# Patient Record
Sex: Female | Born: 1960 | Race: White | Hispanic: No | Marital: Married | Smoking: Former smoker
Health system: Southern US, Community
[De-identification: ages and names within clinical notes are randomized; demographics above are authoritative.]

## PROBLEM LIST (undated history)

## (undated) DIAGNOSIS — E039 Hypothyroidism, unspecified: Secondary | ICD-10-CM

## (undated) DIAGNOSIS — G47419 Narcolepsy without cataplexy: Secondary | ICD-10-CM

---

## 1992-11-20 HISTORY — PX: ABDOMINAL HYSTERECTOMY: SHX81

## 2014-09-20 ENCOUNTER — Ambulatory Visit (INDEPENDENT_AMBULATORY_CARE_PROVIDER_SITE_OTHER): Payer: Commercial Managed Care - PPO | Admitting: Podiatry

## 2014-09-20 ENCOUNTER — Ambulatory Visit (INDEPENDENT_AMBULATORY_CARE_PROVIDER_SITE_OTHER): Payer: Commercial Managed Care - PPO

## 2014-09-20 ENCOUNTER — Encounter: Payer: Self-pay | Admitting: Podiatry

## 2014-09-20 VITALS — BP 124/81 | HR 79 | Resp 16 | Ht 67.0 in | Wt 170.0 lb

## 2014-09-20 DIAGNOSIS — M722 Plantar fascial fibromatosis: Secondary | ICD-10-CM

## 2014-09-20 MED ORDER — TRIAMCINOLONE ACETONIDE 10 MG/ML IJ SUSP
10.0000 mg | Freq: Once | INTRAMUSCULAR | Status: AC
  Administered 2014-09-20: 10 mg

## 2014-09-20 MED ORDER — DICLOFENAC SODIUM 75 MG PO TBEC: 75.0000 mg | DELAYED_RELEASE_TABLET | Freq: Two times a day (BID) | ORAL | Status: DC

## 2014-09-20 NOTE — Progress Notes (Signed)
Subjective:     Patient ID: Joan HansonSusan Case, female   DOB: 02-Apr-1961, 53 y.o.   MRN: [id number]  Foot Pain   patient states my right heel has been hurting me for around a year and has worsened over the last couple months. Does not remember initial injury   Review of Systems  All other systems reviewed and are negative.      Objective:   Physical Exam  Nursing note and vitals reviewed. Cardiovascular: Intact distal pulses.   Musculoskeletal: Normal range of motion.  Neurological: She is alert.  Skin: Skin is warm.   neurovascular status intact with muscle strength adequate and range of motion subtalar and midtarsal joint within normal limits. Patient's noted to have well-perfused toes moderate diminishment of arch upon weightbearing and is noted to have severe discomfort on the medial plantar fascial insertion into the calcaneus     Assessment:     Plantar fasciitis of the right heel with inflammation and fluid around the medial band    Plan:     H&P and x-ray reviewed and today I injected the right plantar fascia 3 mg Kenalog 5 mg Xylocaine and applied fascially brace placed on diclofenac 75 mg twice a day and gave instructions on physical therapy and supportive shoe gear. Reappoint her recheck in one week

## 2014-09-20 NOTE — Patient Instructions (Signed)

## 2014-09-20 NOTE — Progress Notes (Signed)
   Subjective:    Patient ID: Joan HansonSusan Case, female    DOB: 05/14/1961, 53 y.o.   MRN: [id number]  HPI Comments: "I have pain in the heel"  Patient c/o stabbing sensations plantar heel right for about 1 year. Worsened recently, last couple months. Pain AM. She is active in racquetball and much worse afterwards. Tried brace for plantar fasciitis-no much help, advil-some help.  Foot Pain      Review of Systems  Eyes: Positive for redness.  Musculoskeletal: Positive for gait problem.  Allergic/Immunologic: Positive for environmental allergies and food allergies.  All other systems reviewed and are negative.      Objective:   Physical Exam        Assessment & Plan:

## 2014-09-27 ENCOUNTER — Encounter: Payer: Self-pay | Admitting: Podiatry

## 2014-09-27 ENCOUNTER — Ambulatory Visit (INDEPENDENT_AMBULATORY_CARE_PROVIDER_SITE_OTHER): Payer: Commercial Managed Care - PPO | Admitting: Podiatry

## 2014-09-27 VITALS — BP 128/71 | HR 78 | Resp 13

## 2014-09-27 DIAGNOSIS — M722 Plantar fascial fibromatosis: Secondary | ICD-10-CM

## 2014-09-27 NOTE — Progress Notes (Signed)
Subjective:     Patient ID: Joan HansonSusan Case, female   DOB: 10-29-1961, 53 y.o.   MRN: [id number]  HPI patient presents stating I'm doing quite a bit better but I know that had a year history of this problem. Points the plantar aspect heel right   Review of Systems     Objective:   Physical Exam Neurovascular status intact with discomfort in the plantar aspect right heel at the insertion of the tendon into the calcaneus    Assessment:     Plantar fasciitis right with inflammation and fluid buildup around the medial band that is improved but still present with mechanical dysfunction of the foot noted    Plan:     Reviewed condition discussed physical therapy and supportive shoe gear usage and today recommended orthotics with scans performed for custom like devices to wear

## 2015-01-08 ENCOUNTER — Ambulatory Visit: Payer: Commercial Managed Care - PPO | Admitting: *Deleted

## 2015-01-08 DIAGNOSIS — M722 Plantar fascial fibromatosis: Secondary | ICD-10-CM

## 2015-01-08 NOTE — Patient Instructions (Signed)

## 2015-01-08 NOTE — Progress Notes (Signed)
PICKING UP MY ORTHOTICS  

## 2015-01-23 ENCOUNTER — Encounter: Payer: Self-pay | Admitting: Podiatry

## 2015-01-23 ENCOUNTER — Ambulatory Visit (INDEPENDENT_AMBULATORY_CARE_PROVIDER_SITE_OTHER): Payer: Commercial Managed Care - PPO | Admitting: Podiatry

## 2015-01-23 VITALS — BP 169/95 | HR 81 | Resp 16

## 2015-01-23 DIAGNOSIS — M722 Plantar fascial fibromatosis: Secondary | ICD-10-CM

## 2015-01-23 MED ORDER — MELOXICAM 15 MG PO TABS: 15.0000 mg | ORAL_TABLET | Freq: Every day | ORAL | Status: DC

## 2015-01-23 MED ORDER — TRIAMCINOLONE ACETONIDE 10 MG/ML IJ SUSP
10.0000 mg | Freq: Once | INTRAMUSCULAR | Status: AC
  Administered 2015-01-23: 10 mg

## 2015-01-23 NOTE — Progress Notes (Signed)
Subjective:     Patient ID: Joan HansonSusan Case, female   DOB: 29-Mar-1961, 54 y.o.   MRN: [id number]  HPI patient presents stating my right foot has been really sore in the heel for the last couple of months. Not remember specific injury but it has been bothering her specially when she gets up in the morning   Review of Systems     Objective:   Physical Exam Neurovascular status intact with no other changes noted and severe discomfort in the plantar heel right at the insertion of the tendon into the calcaneus    Assessment:     Acute plantar fasciitis right with inflammation and fluid buildup    Plan:     Reviewed condition and injected the plantar fascia 3 mg Kenalog 5 g Xylocaine and went ahead today and dispensed night splint with all instructions on usage

## 2017-03-17 ENCOUNTER — Ambulatory Visit (INDEPENDENT_AMBULATORY_CARE_PROVIDER_SITE_OTHER): Payer: Commercial Managed Care - PPO | Admitting: Orthopaedic Surgery

## 2017-03-17 ENCOUNTER — Ambulatory Visit (INDEPENDENT_AMBULATORY_CARE_PROVIDER_SITE_OTHER): Payer: Commercial Managed Care - PPO

## 2017-03-17 DIAGNOSIS — M25521 Pain in right elbow: Secondary | ICD-10-CM

## 2017-03-17 NOTE — Progress Notes (Signed)
   Office Visit Note   Patient: Joan Case           Date of Birth: 18-Nov-1961           MRN: [id number] Visit Date: 03/17/2017              Requested by: No referring provider defined for this encounter. PCP: No PCP Per Patient   Assessment & Plan: Visit Diagnoses:  1. Pain in right elbow     Plan: I gave her reassurance that her range of motion should improve with time and the fracture still need to heal. I want her to perform his much activities as she can tolerate without elbow including roping. I like see her back in 6 weeks I would like actually an AP lateral and oblique for 3 views of the right elbow.  Follow-Up Instructions: Return in about 6 weeks (around 04/28/2017).   Orders:  Orders Placed This Encounter  Procedures  . XR Elbow 2 Views Right   No orders of the defined types were placed in this encounter.     Procedures: No procedures performed   Clinical Data: No additional findings.   Subjective: No chief complaint on file. The patient is a very pleasant right-hand-dominant 56 year old who comes for second opinion as a relates to her right elbow. In November 2017 she injured both her left and right arms. Her right elbow still bothers her she is a very active individual and she said the elbow is bothersome it hurts he could fully extend it. 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 She denies any numbness and tingling.  HPI  Review of Systems He denies any chest pain, headache, short of breath, fever, chills, nausea, vomiting  Objective: Vital Signs: There were no vitals taken for this visit.  Physical Exam He is alert and oriented 3 and in no acute distress Ortho Exam Examination of her right elbow shows full flexion but she lacks full extension by about 5. Her pronation and supination are full. She is neurovascular intact. The elbow is painful and stressing around the radial head. Specialty Comments:    No specialty comments available.  Imaging: No results found. 2 views of the right elbow show a subacute nondisplaced right radial head fracture. The elbow is well located and in good alignment and position.  PMFS History: There are no active problems to display for this patient.  No past medical history on file.  No family history on file.  No past surgical history on file. Social History   Occupational History  . Not on file.   Social History Main Topics  . Smoking status: Current Some Day Smoker  . Smokeless tobacco: Not on file  . Alcohol use Not on file  . Drug use: Unknown  . Sexual activity: Not on file

## 2018-11-30 ENCOUNTER — Emergency Department (HOSPITAL_COMMUNITY)
Admission: EM | Admit: 2018-11-30 | Discharge: 2018-11-30 | Disposition: A | Discharge status: Home / Self Care | Payer: Self-pay | Attending: Emergency Medicine | Admitting: Emergency Medicine

## 2018-11-30 ENCOUNTER — Emergency Department (HOSPITAL_COMMUNITY): Payer: Self-pay

## 2018-11-30 ENCOUNTER — Encounter (HOSPITAL_COMMUNITY): Payer: Self-pay | Admitting: Student

## 2018-11-30 ENCOUNTER — Other Ambulatory Visit: Payer: Self-pay

## 2018-11-30 DIAGNOSIS — Z79899 Other long term (current) drug therapy: Secondary | ICD-10-CM | POA: Insufficient documentation

## 2018-11-30 DIAGNOSIS — F17210 Nicotine dependence, cigarettes, uncomplicated: Secondary | ICD-10-CM | POA: Insufficient documentation

## 2018-11-30 DIAGNOSIS — Z91010 Allergy to peanuts: Secondary | ICD-10-CM | POA: Insufficient documentation

## 2018-11-30 DIAGNOSIS — N200 Calculus of kidney: Secondary | ICD-10-CM | POA: Insufficient documentation

## 2018-11-30 LAB — URINALYSIS, ROUTINE W REFLEX MICROSCOPIC
Bacteria, UA: NONE SEEN
Bilirubin Urine: NEGATIVE
Glucose, UA: NEGATIVE mg/dL
Hgb urine dipstick: NEGATIVE
Ketones, ur: 5 mg/dL — AB
Leukocytes, UA: NEGATIVE
Nitrite: NEGATIVE
Protein, ur: NEGATIVE mg/dL
Specific Gravity, Urine: 1.011 (ref 1.005–1.030)
pH: 7 (ref 5.0–8.0)

## 2018-11-30 LAB — COMPREHENSIVE METABOLIC PANEL
ALT: 24 U/L (ref 0–44)
AST: 20 U/L (ref 15–41)
Albumin: 3.7 g/dL (ref 3.5–5.0)
Alkaline Phosphatase: 88 U/L (ref 38–126)
Anion gap: 9 (ref 5–15)
BUN: 21 mg/dL — AB (ref 6–20)
CO2: 24 mmol/L (ref 22–32)
Calcium: 9.3 mg/dL (ref 8.9–10.3)
Chloride: 106 mmol/L (ref 98–111)
Creatinine, Ser: 0.84 mg/dL (ref 0.44–1.00)
GFR calc Af Amer: >60 mL/min (ref >60)
GFR calc non Af Amer: >60 mL/min (ref >60)
Glucose, Bld: 102 mg/dL — ABNORMAL HIGH (ref 70–99)
Potassium: 4.1 mmol/L (ref 3.5–5.1)
Sodium: 139 mmol/L (ref 135–145)
Total Bilirubin: 1.1 mg/dL (ref 0.3–1.2)
Total Protein: 6.7 g/dL (ref 6.5–8.1)

## 2018-11-30 LAB — LIPASE, BLOOD: Lipase: 33 U/L (ref 11–51)

## 2018-11-30 LAB — CBC
HCT: 39.8 % (ref 36.0–46.0)
Hemoglobin: 12.7 g/dL (ref 12.0–15.0)
MCH: 28.5 pg (ref 26.0–34.0)
MCHC: 31.9 g/dL (ref 30.0–36.0)
MCV: 89.4 fL (ref 80.0–100.0)
Platelets: 285 10*3/uL (ref 150–400)
RBC: 4.45 MIL/uL (ref 3.87–5.11)
RDW: 12.5 % (ref 11.5–15.5)
WBC: 8.8 10*3/uL (ref 4.0–10.5)
nRBC: 0 % (ref 0.0–0.2)

## 2018-11-30 MED ORDER — OXYCODONE-ACETAMINOPHEN 5-325 MG PO TABS: 1.0000 | ORAL_TABLET | Freq: Four times a day (QID) | ORAL | 0 refills | Status: DC | PRN

## 2018-11-30 MED ORDER — ONDANSETRON HCL 4 MG/2ML IJ SOLN
4.0000 mg | Freq: Once | INTRAMUSCULAR | Status: AC
  Administered 2018-11-30: 4 mg via INTRAVENOUS
  Filled 2018-11-30: qty 2

## 2018-11-30 MED ORDER — SODIUM CHLORIDE 0.9 % IV BOLUS
1000.0000 mL | Freq: Once | INTRAVENOUS | Status: AC
  Administered 2018-11-30: 1000 mL via INTRAVENOUS

## 2018-11-30 MED ORDER — TAMSULOSIN HCL 0.4 MG PO CAPS: 0.4000 mg | ORAL_CAPSULE | Freq: Every day | ORAL | 0 refills | Status: DC

## 2018-11-30 MED ORDER — IBUPROFEN 800 MG PO TABS: 800.0000 mg | ORAL_TABLET | Freq: Three times a day (TID) | ORAL | 0 refills | Status: DC

## 2018-11-30 MED ORDER — MORPHINE SULFATE (PF) 4 MG/ML IV SOLN
4.0000 mg | Freq: Once | INTRAVENOUS | Status: AC
  Administered 2018-11-30: 4 mg via INTRAVENOUS
  Filled 2018-11-30: qty 1

## 2018-11-30 MED ORDER — HYDROMORPHONE HCL 1 MG/ML IJ SOLN
1.0000 mg | Freq: Once | INTRAMUSCULAR | Status: AC
  Administered 2018-11-30: 1 mg via INTRAVENOUS
  Filled 2018-11-30: qty 1

## 2018-11-30 MED ORDER — SODIUM CHLORIDE 0.9 % IV SOLN
INTRAVENOUS | Status: DC
  Administered 2018-11-30: 19:00:00 via INTRAVENOUS

## 2018-11-30 MED ORDER — KETOROLAC TROMETHAMINE 15 MG/ML IJ SOLN
15.0000 mg | Freq: Once | INTRAMUSCULAR | Status: AC
  Administered 2018-11-30: 15 mg via INTRAVENOUS
  Filled 2018-11-30: qty 1

## 2018-11-30 MED ORDER — ONDANSETRON 4 MG PO TBDP: 4.0000 mg | ORAL_TABLET | Freq: Three times a day (TID) | ORAL | 0 refills | Status: DC | PRN

## 2018-11-30 MED ORDER — HYDROMORPHONE HCL 1 MG/ML IJ SOLN
0.5000 mg | Freq: Once | INTRAMUSCULAR | Status: AC
  Administered 2018-11-30: 0.5 mg via INTRAVENOUS
  Filled 2018-11-30: qty 1

## 2018-11-30 NOTE — ED Notes (Signed)
Patient transported to CT 

## 2018-11-30 NOTE — ED Provider Notes (Signed)
[facility] EMERGENCY [facility]PARTMENT Provider Note   CSN: [id number] Arrival date & time: 11/30/18  1708     History   Chief Complaint Chief Complaint  Patient presents with  . Flank Pain    HPI Karena AddisonSusan Haltiwanger is a 57 y.o. female with a hx of tobacco abuse and narcolepsy who presents to the ED with complaints of L flank pain that started at 15:30 today. Patient states that yesterday she developed some increased urinary frequency and then today developed pain. She states pain is in the L flank and radiates to the LLQ. Pain waxes/wanes, currently a 10/10 in severity without specific alleviating/aggravating factors. Has had some nausea without vomiting. Denies fever, chills, vomiting, dysuria, hematuria, or constipation. She has had a kidney stone several years ago and this feels somewhat similar.   HPI  History reviewed. No pertinent past medical history.  There are no active problems to display for this patient.   History reviewed. No pertinent surgical history.   OB History   None      Home Medications    Prior to Admission medications   Medication Sig Start Date End Date Taking? Authorizing Provider  diclofenac (VOLTAREN) 75 MG EC tablet Take 1 tablet (75 mg total) by mouth 2 (two) times daily. 09/20/14   Lenn Sinkegal, Norman S, DPM  levothyroxine (LEVOTHROID) 125 MCG tablet Take 125 mcg by mouth daily before breakfast.    [provider]  lisdexamfetamine (VYVANSE) 70 MG capsule Take 70 mg by mouth daily.    [provider]  meloxicam (MOBIC) 15 MG tablet Take 1 tablet (15 mg total) by mouth daily. 01/23/15   Lenn Sinkegal, Norman S, DPM    Family History History reviewed. No pertinent family history.  Social History Social History   Tobacco Use  . Smoking status: Current Some Day Smoker  Substance Use Topics  . Alcohol use: Not on file  . Drug use: Not on file     Allergies   Betadine [povidone iodine]; Fruit & vegetable daily [nutritional  supplements]; and Peanut-containing drug products   Review of Systems Review of Systems  Constitutional: Negative for chills and fever.  Respiratory: Negative for shortness of breath.   Cardiovascular: Negative for chest pain.  Gastrointestinal: Positive for abdominal pain and nausea. Negative for constipation and vomiting.  Genitourinary: Positive for flank pain and frequency. Negative for dysuria, vaginal bleeding and vaginal discharge.  All other systems reviewed and are negative.    Physical Exam Updated Vital Signs BP (!) 171/114 (BP Location: Right Arm)   Pulse 77   Temp 97.8 F (36.6 C) (Oral)   Resp (!) 22   Ht 5\' 7"  (1.702 m)   Wt 74.8 kg   SpO2 100%   BMI 25.84 kg/m   Physical Exam  Constitutional: She appears well-developed and well-nourished.  Non-toxic appearance. She appears distressed (appears somewhat uncomfortable. ).  HENT:  Head: Normocephalic and atraumatic.  Eyes: Conjunctivae are normal. Right eye exhibits no discharge. Left eye exhibits no discharge.  Neck: Neck supple.  Cardiovascular: Normal rate and regular rhythm.  Pulmonary/Chest: Effort normal and breath sounds normal. No respiratory distress. She has no wheezes. She has no rhonchi. She has no rales.  Respiration even and unlabored  Abdominal: Soft. She exhibits no distension. There is no tenderness. There is no rigidity, no rebound and no guarding.  Neurological: She is alert.  Clear speech.   Skin: Skin is warm and dry. No rash noted.  Psychiatric: She has a  normal mood and affect. Her behavior is normal.  Nursing note and vitals reviewed.    ED Treatments / Results  Labs (all labs ordered are listed, but only abnormal results are displayed) Labs Reviewed  URINALYSIS, ROUTINE W REFLEX MICROSCOPIC - Abnormal; Notable for the following components:      Result Value   Color, Urine STRAW (*)    Ketones, ur 5 (*)    All other components within normal limits  COMPREHENSIVE METABOLIC PANEL  - Abnormal; Notable for the following components:   Glucose, Bld 102 (*)    BUN 21 (*)    All other components within normal limits  CBC  LIPASE, BLOOD    EKG None  Radiology Ct Renal Stone Study  Result Date: 11/30/2018 CLINICAL DATA:  Flank pain EXAM: CT ABDOMEN AND PELVIS WITHOUT CONTRAST TECHNIQUE: Multidetector CT imaging of the abdomen and pelvis was performed following the standard protocol without IV contrast. COMPARISON:  None. FINDINGS: Lower chest: No acute abnormality. Hepatobiliary: No focal liver abnormality is seen. No gallstones, gallbladder wall thickening, or biliary dilatation. Pancreas: Unremarkable. No pancreatic ductal dilatation or surrounding inflammatory changes. Spleen: Normal in size without focal abnormality. Adrenals/Urinary Tract: Adrenal glands are unremarkable. 2 mm left distal ureteral calculus resulting in mild left hydroureteronephrosis. No other urolithiasis. Normal decompressed bladder. Bladder is unremarkable. Stomach/Bowel: Stomach is within normal limits. No evidence of bowel wall thickening, distention, or inflammatory changes. Vascular/Lymphatic: No significant vascular findings are present. No enlarged abdominal or pelvic lymph nodes. Reproductive: Status post hysterectomy. No adnexal masses. Other: No abdominal wall hernia or abnormality. No abdominopelvic ascites. Musculoskeletal: No acute osseous abnormality. No aggressive osseous lesion. Grade 1 anterolisthesis of L4 on L5 secondary to facet disease. Minimal retrolisthesis of L1 on L2 and L2 on L3. Facet arthropathy throughout the lumbar spine. IMPRESSION: 1. 2 mm left distal ureteral calculus resulting in mild left hydroureteronephrosis. Electronically Signed   By: Elige Ko   On: 11/30/2018 18:14    Procedures Procedures (including critical care time)  Medications Ordered in ED Medications  sodium chloride 0.9 % bolus 1,000 mL (1,000 mLs Intravenous New Bag/Given 11/30/18 1745)    And  0.9  %  sodium chloride infusion (has no administration in time range)  morphine 4 MG/ML injection 4 mg (4 mg Intravenous Given 11/30/18 1744)  ondansetron (ZOFRAN) injection 4 mg (4 mg Intravenous Given 11/30/18 1744)     Initial Impression / Assessment and Plan / ED Course  I have reviewed the triage vital signs and the nursing notes.  Pertinent labs & imaging results that were available during my care of the patient were reviewed by me and considered in my medical decision making (see chart for details).    Patient presents to the ED with complaints of flank/abdominal pain & urinary frequency. Patient nontoxic, appears uncomfortable, BP elevated. On exam patient without peritoneal signs. DDX: nephrolithiasis, pyelonephritis/UTI, cholecystitis, pancreatitis, bowel obstruction/perforation, appendicitis, dissection, feel nephrolithiasis is most likely at this time will evaluate with labs and CT renal study, analgesics, anti-emetics, and fluids ordered.   No leukocytosis, anemia, or significant electrolyte derangements. LFTs/lipase WNL. CT renal study with 2 mm left distal ureteral calculus resulting in mild left hydroureteronephrosis. Renal function preserved. Urinalysis without appearance of superimposed infection. Patient tolerating PO in the ER with pain well controlled. Will discharge home with Flomax, Zofran, NSAID, and Percocet with urology follow up. [place] Controlled Substance reporting System queried. I discussed results, treatment plan, need for urology follow-up, and return precautions  with the patient. Provided opportunity for questions, patient confirmed understanding and is in agreement with plan.   Vitals:   11/30/18 1830 11/30/18 2045  BP: (!) 161/84 126/67  Pulse: 84 71  Resp: (!) 25 14  Temp:    SpO2: 97% 97%    Final Clinical Impressions(s) / ED Diagnoses   Final diagnoses:  Kidney stone    ED Discharge Orders         Ordered    ondansetron (ZOFRAN ODT) 4 MG  disintegrating tablet  Every 8 hours PRN     11/30/18 2045    ibuprofen (ADVIL,MOTRIN) 800 MG tablet  3 times daily     11/30/18 2045    tamsulosin (FLOMAX) 0.4 MG CAPS capsule  Daily after supper     11/30/18 2045    oxyCODONE-acetaminophen (PERCOCET/ROXICET) 5-325 MG tablet  Every 6 hours PRN     11/30/18 2045           Cherly Anderson, PA-C 11/30/18 2058    Virgina Norfolk, DO 11/30/18 2257

## 2018-11-30 NOTE — ED Triage Notes (Signed)
Pt presents to ED with L flank pain started today at 1530. Urinary frequency since yesterday, diarrhea x1 day.

## 2018-11-30 NOTE — Discharge Instructions (Addendum)
You were seen in the emergency department and found to have a kidney stone.  We are sending you home with multiple medications to assist with passing the stone:   -Flomax-this is a medication to help pass the stone, it allows urine to exit the body more freely.  Please take this once daily with a meal.  -Ibuprofen 800 mg-this is a medication that will help with pain as well as passing the stone.  Please take this every 8 hours.  Take this with food as it can cause stomach upset and at worst stomach bleeding.  Do not take other NSAIDs such as Motrin, Aleve, Advil, Mobic, or Naproxen with this medicine as they are similar and would propagate any potential side effects.   -Percocet-this is a narcotic/controlled substance medication that has potential addicting qualities.  We recommend that you take 1-2 tablets every 6 hours as needed for severe pain.  Do not drive or operate heavy machinery when taking this medicine as it can be sedating. Do not drink alcohol or take other sedating medications when taking this medicine for safety reasons.  Keep this out of reach of small children.  Please be aware this medicine has Tylenol in it (325 mg/tab) do not exceed the maximum dose of Tylenol in a day per over the counter recommendations should you decide to supplement with Tylenol over the counter.   -Zofran-this is an antinausea medication, you may take this every 8 hours as needed for nausea and vomiting, please allow the tablet to dissolve underneath of your tongue.   We have prescribed you new medication(s) today. Discuss the medications prescribed today with your pharmacist as they can have adverse effects and interactions with your other medicines including over the counter and prescribed medications. Seek medical evaluation if you start to experience new or abnormal symptoms after taking one of these medicines, seek care immediately if you start to experience difficulty breathing, feeling of your throat  closing, facial swelling, or rash as these could be indications of a more serious allergic reaction  Please follow-up with the urology group provided in your discharge instructions within 3 to 5 days.  Return to the ER for new or worsening symptoms including but not limited to worsening pain not controlled by these medicines, inability to keep fluids down, fever, or any other concerns that you may have.  

## 2018-12-28 ENCOUNTER — Ambulatory Visit (INDEPENDENT_AMBULATORY_CARE_PROVIDER_SITE_OTHER): Payer: BLUE CROSS/BLUE SHIELD | Admitting: Orthopaedic Surgery

## 2018-12-28 ENCOUNTER — Ambulatory Visit (INDEPENDENT_AMBULATORY_CARE_PROVIDER_SITE_OTHER): Payer: Self-pay

## 2018-12-28 DIAGNOSIS — M25562 Pain in left knee: Secondary | ICD-10-CM | POA: Diagnosis not present

## 2018-12-28 DIAGNOSIS — G8929 Other chronic pain: Secondary | ICD-10-CM | POA: Diagnosis not present

## 2018-12-28 MED ORDER — DICLOFENAC SODIUM 75 MG PO TBEC: 75.0000 mg | DELAYED_RELEASE_TABLET | Freq: Two times a day (BID) | ORAL | 2 refills | Status: DC | PRN

## 2018-12-28 MED ORDER — METHYLPREDNISOLONE ACETATE 40 MG/ML IJ SUSP
40.0000 mg | INTRAMUSCULAR | Status: AC | PRN
  Administered 2018-12-28: 40 mg via INTRA_ARTICULAR

## 2018-12-28 MED ORDER — LIDOCAINE HCL 1 % IJ SOLN
3.0000 mL | INTRAMUSCULAR | Status: AC | PRN
  Administered 2018-12-28: 3 mL

## 2018-12-28 NOTE — Progress Notes (Signed)
Office Visit Note   Patient: Joan Case           Date of Birth: 09-04-1961           MRN: [id number] Visit Date: 12/28/2018              Requested by: No referring provider defined for this encounter. PCP: Patient, No Pcp Per   Assessment & Plan: Visit Diagnoses:  1. Chronic pain of left knee     Plan: I did speak with her about trying a steroid injection in her left knee today and she is agreeable to this.  I will refill her diclofenac.  I would like her to come back in about 2 weeks to see if she can bring me the MRI study and the report so we can get a better idea of what else may be going on with the knee.  All questions concerns were answered and addressed.  We will see her back in 2 weeks.  Follow-Up Instructions: Return in about 2 years (around 12/28/2020).   Orders:  Orders Placed This Encounter  Procedures  . Large Joint Inj  . XR Knee 1-2 Views Left   Meds ordered this encounter  Medications  . diclofenac (VOLTAREN) 75 MG EC tablet    Sig: Take 1 tablet (75 mg total) by mouth 2 (two) times daily between meals as needed.    Dispense:  60 tablet    Refill:  2      Procedures: Large Joint Inj: R knee on 12/28/2018 3:33 PM Indications: diagnostic evaluation and pain Details: 22 G 1.5 in needle, superolateral approach  Arthrogram: No  Medications: 3 mL lidocaine 1 %; 40 mg methylPREDNISolone acetate 40 MG/ML Outcome: tolerated well, no immediate complications Procedure, treatment alternatives, risks and benefits explained, specific risks discussed. Consent was given by the patient. Immediately prior to procedure a time out was called to verify the correct patient, procedure, equipment, support staff and site/side marked as required. Patient was prepped and draped in the usual sterile fashion.       Clinical Data: No additional findings.   Subjective: Chief Complaint  Patient presents with  . Left Knee - Pain  The patient comes in today for evaluation  treatment of left knee pain.  She had a twisting injury to this knee a few months ago.  She hurts in the posterior medial aspect of the knee and cannot do squats well.  The knee does not swell on her but she is getting pretty much chronic pain off and on in the knee now and does wake her up at night.  She has not injured this knee before.  She is injured her right knee before but has no issues now.  She did report that she had [place] in [place] in the emergency room but was not told any results.  She does take diclofenac on occasion and this did help but she does not have it anymore.  HPI  Review of Systems She currently denies any headache, chest pain, shortness of breath, fever, chills, nausea, vomiting.  Objective: Vital Signs: There were no vitals taken for this visit.  Physical Exam She is alert and orient x3 and in no acute distress Ortho Exam Examination of her left knee shows a positive Murray sign to the medial compartment of the knee.  Ligamentously the knee feels stable with a negative Lockman's exam.  There is no instability on varus valgus stressing.  There is no effusion.  She has pain past 90 degrees of flexion to the medial side. Specialty Comments:  No specialty comments available.  Imaging: Xr Knee 1-2 Views Left  Result Date: 12/28/2018 2 views of the left knee show well-maintained medial lateral compartment joint spaces.  There is mild patellofemoral narrowing.  There is otherwise no acute findings.    PMFS History: There are no active problems to display for this patient.  No past medical history on file.  No family history on file.  No past surgical history on file. Social History   Occupational History  . Not on file  Tobacco Use  . Smoking status: Current Some Day Smoker  Substance and Sexual Activity  . Alcohol use: Not on file  . Drug use: Not on file  . Sexual activity: Not on file

## 2019-06-27 ENCOUNTER — Other Ambulatory Visit: Payer: Self-pay

## 2019-06-27 ENCOUNTER — Ambulatory Visit (INDEPENDENT_AMBULATORY_CARE_PROVIDER_SITE_OTHER): Payer: BC Managed Care – PPO | Admitting: Orthopaedic Surgery

## 2019-06-27 DIAGNOSIS — G8929 Other chronic pain: Secondary | ICD-10-CM | POA: Diagnosis not present

## 2019-06-27 DIAGNOSIS — L237 Allergic contact dermatitis due to plants, except food: Secondary | ICD-10-CM | POA: Diagnosis not present

## 2019-06-27 DIAGNOSIS — M25562 Pain in left knee: Secondary | ICD-10-CM

## 2019-06-27 MED ORDER — METHYLPREDNISOLONE ACETATE 40 MG/ML IJ SUSP
40.0000 mg | INTRAMUSCULAR | Status: AC | PRN
  Administered 2019-06-27: 40 mg via INTRA_ARTICULAR

## 2019-06-27 MED ORDER — LIDOCAINE HCL 1 % IJ SOLN
3.0000 mL | INTRAMUSCULAR | Status: AC | PRN
  Administered 2019-06-27: 3 mL

## 2019-06-27 NOTE — Progress Notes (Signed)
Office Visit Note   Patient: Joan Case           Date of Birth: 05/22/1961           MRN: [id number] Visit Date: 06/27/2019              Requested by: No referring provider defined for this encounter. PCP: Patient, No Pcp Per   Assessment & Plan: Visit Diagnoses:  1. Chronic pain of left knee   2. Poison ivy     Plan: At this point I do feel an MRI is warranted for her left knee given the failure of conservative treatment including steroid injections as well as activity modification and rest as well as anti-inflammatories and quad strengthening exercises.  We did place another steroid injection in her knee today given the fact that it may be difficult to get MRI in light of the coronavirus pandemic however we will still order this and likely she will be able to get it.  We also had a steroid injection provided in her gluteal area to treat her poison ivy.  All question concerns were answered and addressed.  We will see her back after the MRI of her left knee.  Follow-Up Instructions: Follow up after left knee MRI  Orders:  Orders Placed This Encounter  Procedures  . Large Joint Inj   No orders of the defined types were placed in this encounter.     Procedures: Large Joint Inj: L knee on 06/27/2019 10:06 AM Indications: diagnostic evaluation and pain Details: 22 G 1.5 in needle, superolateral approach  Arthrogram: No  Medications: 3 mL lidocaine 1 %; 40 mg methylPREDNISolone acetate 40 MG/ML Outcome: tolerated well, no immediate complications Procedure, treatment alternatives, risks and benefits explained, specific risks discussed. Consent was given by the patient. Immediately prior to procedure a time out was called to verify the correct patient, procedure, equipment, support staff and site/side marked as required. Patient was prepped and draped in the usual sterile fashion.       Clinical Data: No additional findings.   Subjective: Chief Complaint  Patient  presents with  . Left Knee - Pain  The patient is well-known to me.  She has been dealing with left knee pain for over 6 to 7 months now.  We saw her in January and place a steroid injection in her knee.  She is still having a stabbing pain with locking catching with that left knee.  She had twisted her right knee as well but the right knee is never been symptomatic.  She did also recently fall into poison ivy and is gotten that over her arms and legs.  HPI  Review of Systems She currently denies any headache, chest pain, shortness of breath, fever, chills, nausea, vomiting  Objective: Vital Signs: There were no vitals taken for this visit.  Physical Exam She is alert and orient x3 and in no acute distress Ortho Exam Examination of her left knee does show positive McMurray sign to the medial compartment.  She has pain past 90 degrees of flexion with no effusion.  The knee feels ligamentously stable.  There is no significant effusion. Specialty Comments:  No specialty comments available.  Imaging: No results found.   PMFS History: There are no active problems to display for this patient.  No past medical history on file.  No family history on file.  No past surgical history on file. Social History   Occupational History  . Not on file  Tobacco Use  . Smoking status: Current Some Day Smoker  Substance and Sexual Activity  . Alcohol use: Not on file  . Drug use: Not on file  . Sexual activity: Not on file

## 2019-07-24 ENCOUNTER — Encounter: Payer: Self-pay | Admitting: *Deleted

## 2019-12-30 IMAGING — CT CT RENAL STONE PROTOCOL
2 of 4 series · 16 of 46 positions shown, 18 images · non-contrast
Comparison: None.

CLINICAL DATA: Flank pain

EXAM:
CT ABDOMEN AND PELVIS WITHOUT CONTRAST
TECHNIQUE: Multidetector CT imaging of the abdomen and pelvis was performed
following the standard protocol without IV contrast.

[Series 3: ap without · axial · non-contrast · 0.84mm/px · z∈[+781,+1181]mm · 13 of 90 slices shown, 15 images]
[im 5/90  soft-tissue]
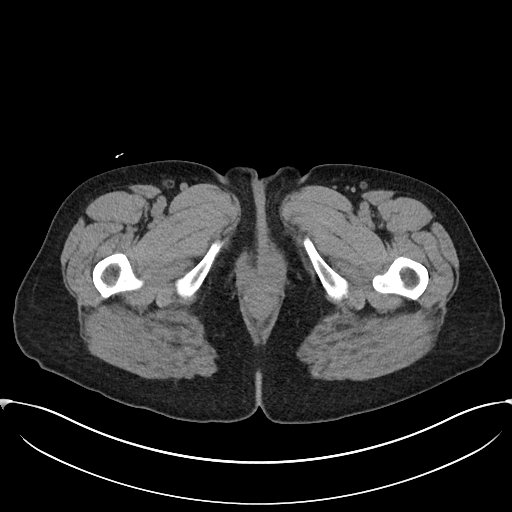
[im 5/90  bone]
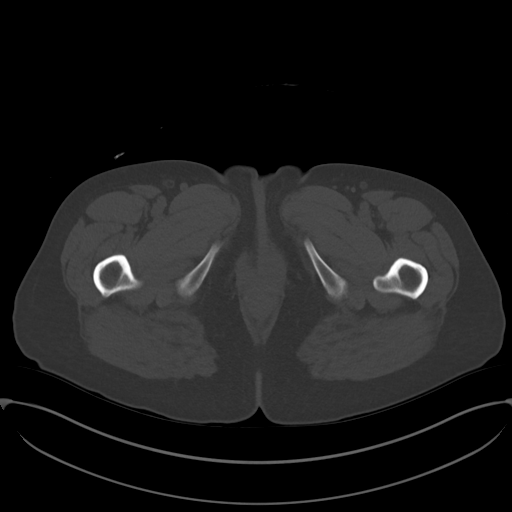
[im 10/90  soft-tissue]
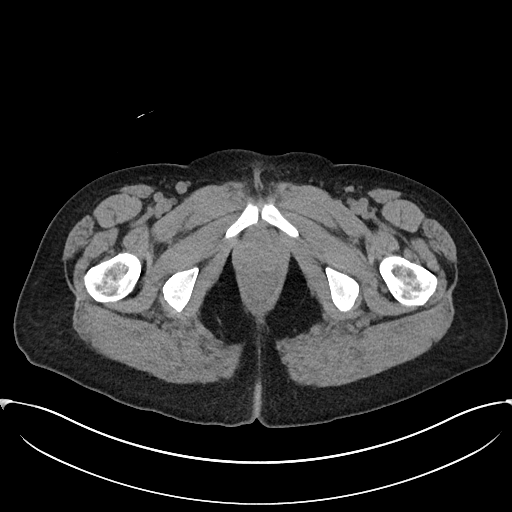
[im 20/90  soft-tissue]
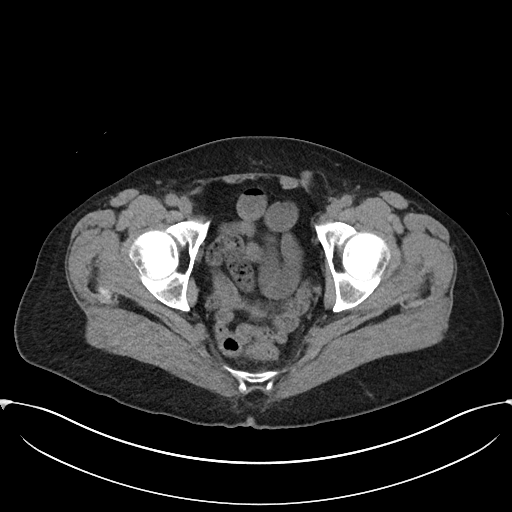
[im 25/90  soft-tissue]
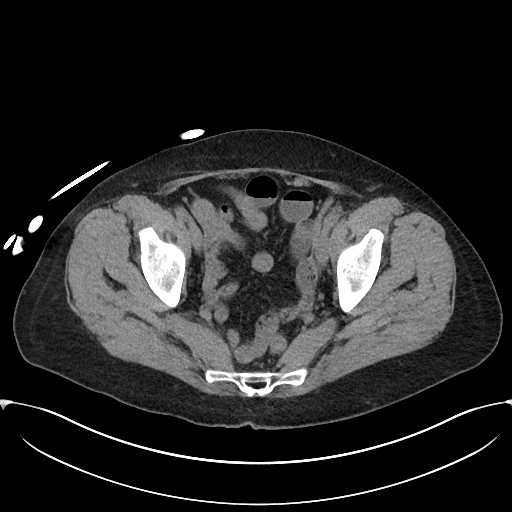
[im 30/90  soft-tissue]
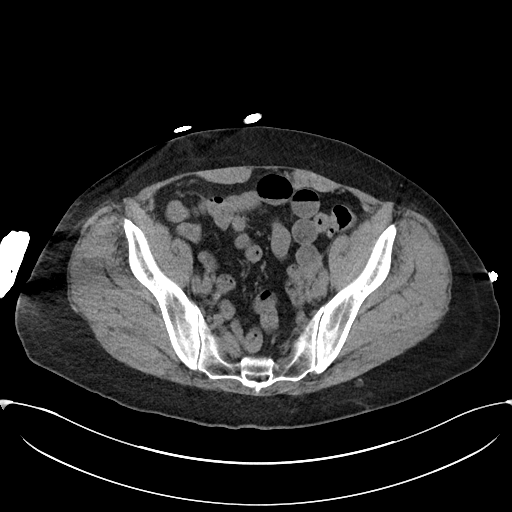
[im 40/90  soft-tissue]
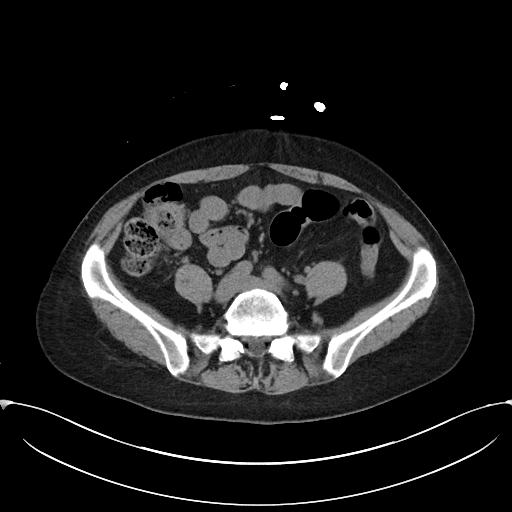
[im 45/90  soft-tissue]
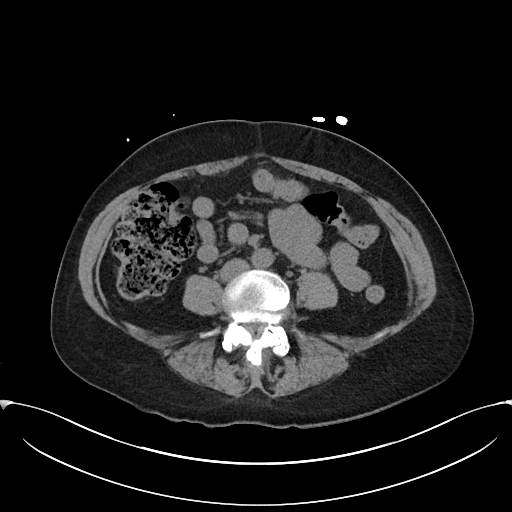
[im 50/90  soft-tissue]
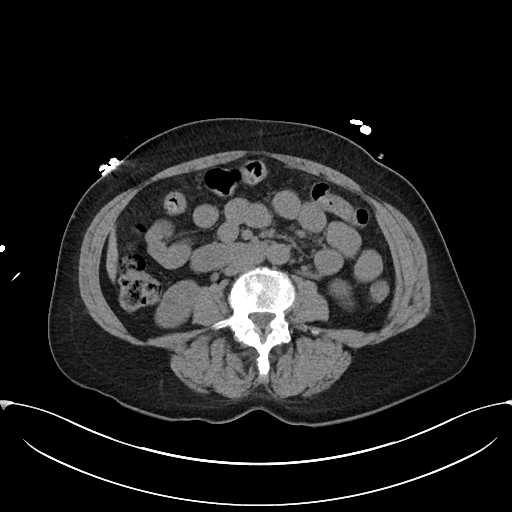
[im 60/90  soft-tissue]
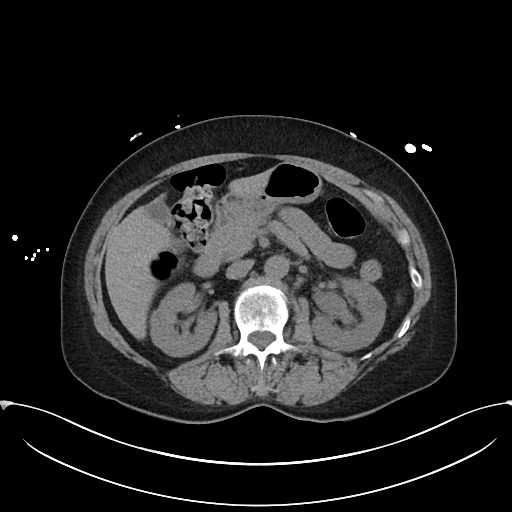
[im 60/90  bone]
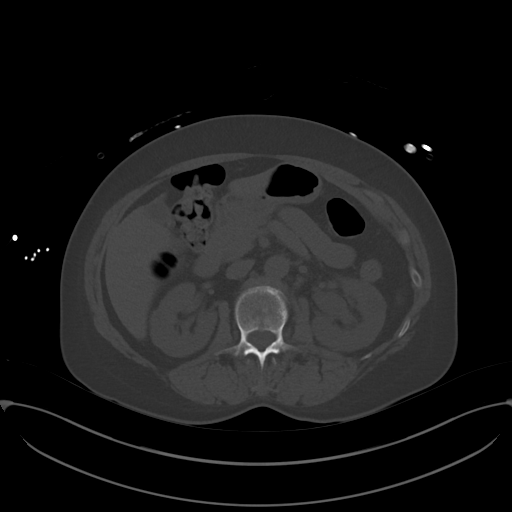
[im 65/90  soft-tissue]
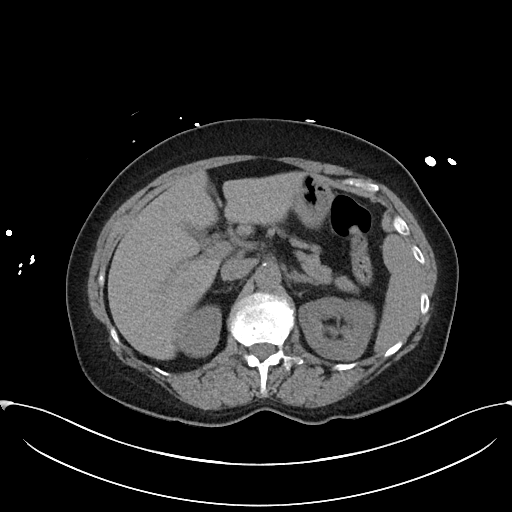
[im 70/90  soft-tissue]
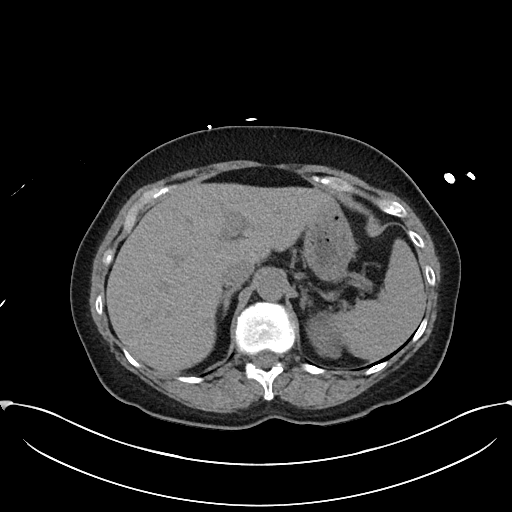
[im 80/90  soft-tissue]
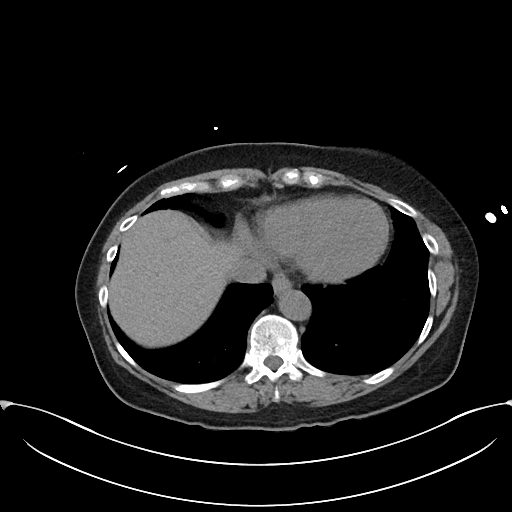
[im 85/90  soft-tissue]
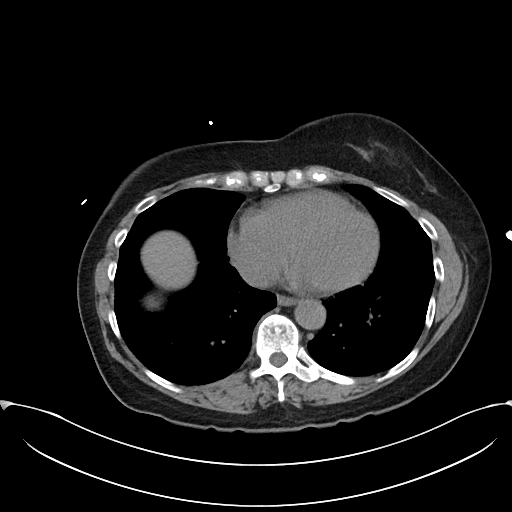

[Series 6: cor · coronal · 0.71mm/px · 3 of 98 slices shown]
[im 33/98  soft-tissue]
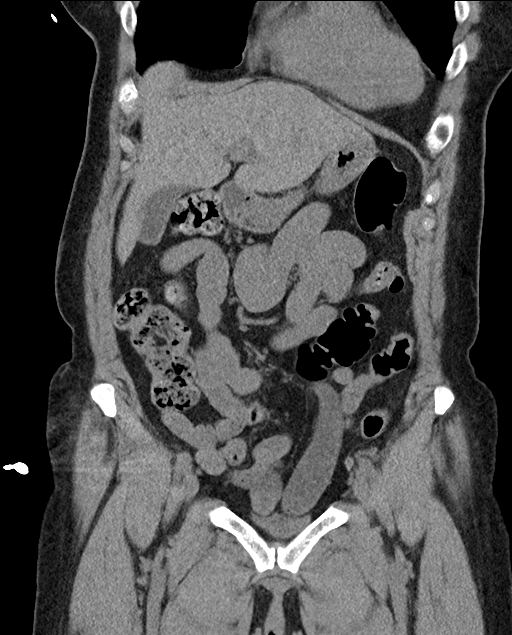
[im 44/98  soft-tissue]
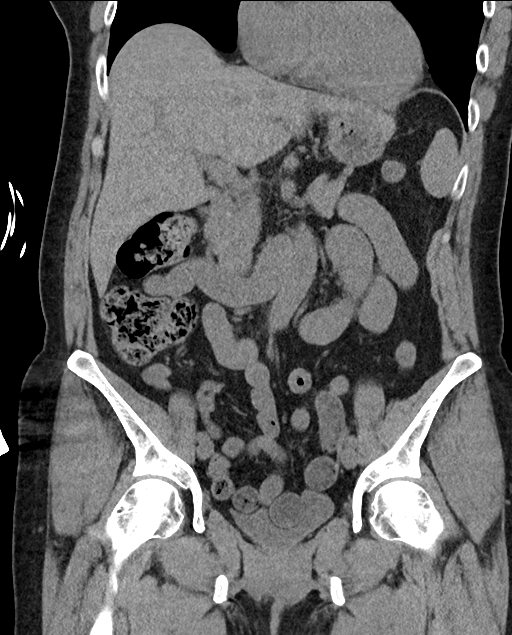
[im 54/98  soft-tissue]
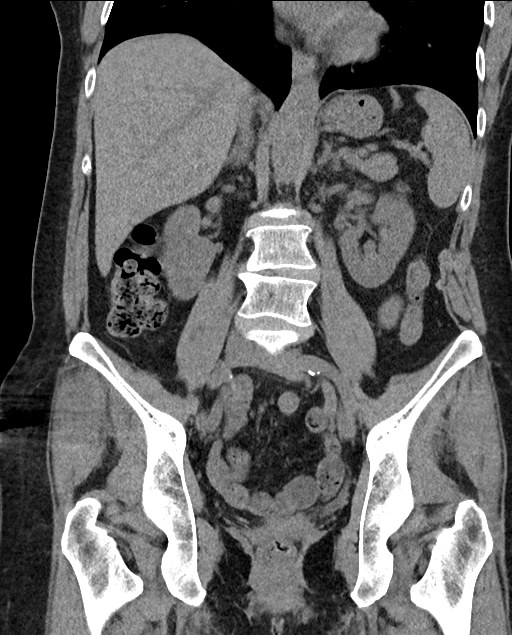

[16 of 46 positions shown; findings below may reference images not displayed]

FINDINGS: Lower chest: No acute abnormality.

Hepatobiliary: No focal liver abnormality is seen. No gallstones,
gallbladder wall thickening, or biliary dilatation.

Pancreas: Unremarkable. No pancreatic ductal dilatation or
surrounding inflammatory changes.

Spleen: Normal in size without focal abnormality.

Adrenals/Urinary Tract: Adrenal glands are unremarkable. 2 mm left
distal ureteral calculus resulting in mild left
hydroureteronephrosis. No other urolithiasis. Normal decompressed
bladder. Bladder is unremarkable.

Stomach/Bowel: Stomach is within normal limits. No evidence of bowel
wall thickening, distention, or inflammatory changes.

Vascular/Lymphatic: No significant vascular findings are present. No
enlarged abdominal or pelvic lymph nodes.

Reproductive: Status post hysterectomy. No adnexal masses.

Other: No abdominal wall hernia or abnormality. No abdominopelvic
ascites.

Musculoskeletal: No acute osseous abnormality. No aggressive osseous
lesion. Grade 1 anterolisthesis of L4 on L5 secondary to facet
disease. Minimal retrolisthesis of L1 on L2 and L2 on L3. Facet
arthropathy throughout the lumbar spine.
IMPRESSION: 1. 2 mm left distal ureteral calculus resulting in mild left
hydroureteronephrosis.

## 2022-02-20 ENCOUNTER — Other Ambulatory Visit: Payer: Self-pay | Admitting: Neurological Surgery

## 2022-03-05 NOTE — Pre-Procedure Instructions (Signed)
Surgical Instructions ? ? ? Your procedure is scheduled on Tuesday, March 10, 2022 at 11:00 AM. ? Report to [facility] Main Entrance "A" at 9:00 A.M., then check in with the Admitting office. ? Call this number if you have problems the morning of surgery: ? [phone] ? ? If you have any questions prior to your surgery date call [phone]: Open Monday-Friday 8am-4pm ? ? ? Remember: ? Do not eat after midnight the night before your surgery ? ?You may drink clear liquids until 8:00 AM the morning of your surgery.   ?Clear liquids allowed are: Water, Non-Citrus Juices (without pulp), Carbonated Beverages, Clear Tea, Black Coffee Only (NO MILK, CREAM OR POWDERED CREAMER of any kind), and Gatorade. ?  ? Take these medicines the morning of surgery with A SIP OF WATER: ? ?levothyroxine (LEVOTHROID) ?lisdexamfetamine (VYVANSE) ? ?IF NEEDED: ?ondansetron (ZOFRAN ODT) ?oxyCODONE-acetaminophen (PERCOCET/ROXICET) ? ? ?As of today, STOP taking any Aspirin (unless otherwise instructed by your surgeon) Aleve, Naproxen, Ibuprofen, Motrin, Advil, Goody's, diclofenac (VOLTAREN), BC's, all herbal medications, fish oil, and all vitamins. ?         ?           ?Do NOT Smoke (Tobacco/Vaping) for 24 hours prior to your procedure. ? ?If you use a CPAP at night, you may bring your mask/headgear for your overnight stay. ?  ?Contacts, glasses, piercing's, hearing aid's, dentures or partials may not be worn into surgery, please bring cases for these belongings.  ?  ?For patients admitted to the hospital, discharge time will be determined by your treatment team. ?  ?Patients discharged the day of surgery will not be allowed to drive home, and someone needs to stay with them for 24 hours. ? ?NO VISITORS WILL BE ALLOWED IN PRE-OP WHERE PATIENTS ARE PREPPED FOR SURGERY.  ONLY 1 SUPPORT PERSON MAY BE PRESENT IN THE WAITING ROOM WHILE YOU ARE IN SURGERY.  IF YOU ARE TO BE ADMITTED, ONCE YOU ARE IN YOUR ROOM YOU WILL BE ALLOWED TWO (2) VISITORS.  (1) VISITOR MAY STAY OVERNIGHT BUT MUST ARRIVE TO THE ROOM BY 8pm.  Minor children may have two parents present. Special consideration for safety and communication needs will be reviewed on a case by case basis. ? ? ?Special instructions:   ?[facility]- Preparing For Surgery ? ?Before surgery, you can play an important role. Because skin is not sterile, your skin needs to be as free of germs as possible. You can reduce the number of germs on your skin by washing with CHG (chlorahexidine gluconate) Soap before surgery.  CHG is an antiseptic cleaner which kills germs and bonds with the skin to continue killing germs even after washing.   ? ?Oral Hygiene is also important to reduce your risk of infection.  Remember - BRUSH YOUR TEETH THE MORNING OF SURGERY WITH YOUR REGULAR TOOTHPASTE ? ?Please do not use if you have an allergy to CHG or antibacterial soaps. If your skin becomes reddened/irritated stop using the CHG.  ?Do not shave (including legs and underarms) for at least 48 hours prior to first CHG shower. It is OK to shave your face. ? ?Please follow these instructions carefully. ?  ?Shower the NIGHT BEFORE SURGERY and the MORNING OF SURGERY ? ?If you chose to wash your hair, wash your hair first as usual with your normal shampoo. ? ?After you shampoo, rinse your hair and body thoroughly to remove the shampoo. ? ?Use CHG Soap as you would any other liquid soap.  You can apply CHG directly to the skin and wash gently with a scrungie or a clean washcloth.  ? ?Apply the CHG Soap to your body ONLY FROM THE NECK DOWN.  Do not use on open wounds or open sores. Avoid contact with your eyes, ears, mouth and genitals (private parts). Wash Face and genitals (private parts)  with your normal soap.  ? ?Wash thoroughly, paying special attention to the area where your surgery will be performed. ? ?Thoroughly rinse your body with warm water from the neck down. ? ?DO NOT shower/wash with your normal soap after using and rinsing  off the CHG Soap. ? ?Pat yourself dry with a CLEAN TOWEL. ? ?Wear CLEAN PAJAMAS to bed the night before surgery ? ?Place CLEAN SHEETS on your bed the night before your surgery ? ?DO NOT SLEEP WITH PETS. ? ? ?Day of Surgery: ?Take a shower with CHG soap. ?Do not wear jewelry or makeup ?Do not wear lotions, powders, perfumes, or deodorant. ?Do not shave 48 hours prior to surgery. ?Do not bring valuables to the hospital.  ?[facility] is not responsible for any belongings or valuables. ?Do not wear nail polish, gel polish, artificial nails, or any other type of covering on natural nails (fingers and toes) ?If you have artificial nails or gel coating that need to be removed by a nail salon, please have this removed prior to surgery. Artificial nails or gel coating may interfere with anesthesia's ability to adequately monitor your vital signs. ?Wear Clean/Comfortable clothing the morning of surgery ?Do not apply any deodorants/lotions.   ?Remember to brush your teeth WITH YOUR REGULAR TOOTHPASTE. ?  ?Please read over the following fact sheets that you were given. ?

## 2022-03-06 ENCOUNTER — Other Ambulatory Visit: Payer: Self-pay

## 2022-03-06 ENCOUNTER — Encounter (HOSPITAL_COMMUNITY)
Admission: RE | Admit: 2022-03-06 | Discharge: 2022-03-06 | Disposition: A | Discharge status: Home / Self Care | Payer: BC Managed Care – PPO | Source: Ambulatory Visit | Attending: Neurological Surgery | Admitting: Neurological Surgery

## 2022-03-06 ENCOUNTER — Encounter (HOSPITAL_COMMUNITY): Payer: Self-pay

## 2022-03-06 VITALS — BP 169/97 | HR 93 | Temp 98.3°F | Resp 17 | Ht 67.0 in | Wt 175.2 lb

## 2022-03-06 DIAGNOSIS — Z01812 Encounter for preprocedural laboratory examination: Secondary | ICD-10-CM | POA: Diagnosis present

## 2022-03-06 DIAGNOSIS — Z01818 Encounter for other preprocedural examination: Secondary | ICD-10-CM

## 2022-03-06 HISTORY — DX: Narcolepsy without cataplexy: G47.419

## 2022-03-06 HISTORY — DX: Hypothyroidism, unspecified: E03.9

## 2022-03-06 LAB — CBC
HCT: 41.6 % (ref 36.0–46.0)
Hemoglobin: 13.6 g/dL (ref 12.0–15.0)
MCH: 29.6 pg (ref 26.0–34.0)
MCHC: 32.7 g/dL (ref 30.0–36.0)
MCV: 90.4 fL (ref 80.0–100.0)
Platelets: 280 10*3/uL (ref 150–400)
RBC: 4.6 MIL/uL (ref 3.87–5.11)
RDW: 12 % (ref 11.5–15.5)
WBC: 6.4 10*3/uL (ref 4.0–10.5)
nRBC: 0 % (ref 0.0–0.2)

## 2022-03-06 LAB — SURGICAL PCR SCREEN

## 2022-03-06 NOTE — Progress Notes (Signed)
PCP - Denies ?Cardiologist - Denies ?Neurologist: Dr. Glory Buff ? ?PPM/ICD - Denies ? ?Chest x-ray - N/A ?EKG - N/A ?Stress Test - Denies ?ECHO - Denies ?Cardiac Cath - Denies ? ?Sleep Study - Yes, for narcolepsy; no OSA ? ?Patient denies having diabetes.  ? ?Blood Thinner Instructions: N/A ?Aspirin Instructions: N/A ? ?ERAS Protcol - Yes ?PRE-SURGERY Ensure or G2- No ? ?COVID TEST- N/A ? ?Type and screen not obtained during PAT appt. Patient states that she is unsure about giving consent for any blood transfusions if the blood is "vaccinated blood". I explained to her that we have no way of knowing this information. She stated that she has a severe allergy to the ingredients in the covid vaccines and is not sure she wants to take a chance of having a reaction. I called Dr. Marcy Siren office and explained the situation to [place]. She said that she would let him know and that he should be fine with not getting the type and screen. ? ?Anesthesia review: No ? ?Patient denies shortness of breath, fever, cough and chest pain at PAT appointment ? ? ?All instructions explained to the patient, with a verbal understanding of the material. Patient agrees to go over the instructions while at home for a better understanding. Patient also instructed to self quarantine after being tested for COVID-19. The opportunity to ask questions was provided. ? ? ?

## 2022-03-10 ENCOUNTER — Ambulatory Visit (HOSPITAL_COMMUNITY): Payer: BC Managed Care – PPO

## 2022-03-10 ENCOUNTER — Encounter (HOSPITAL_COMMUNITY)
Admission: RE | Disposition: A | Discharge status: Home / Self Care | Payer: Self-pay | Source: Ambulatory Visit | Attending: Neurological Surgery

## 2022-03-10 ENCOUNTER — Other Ambulatory Visit: Payer: Self-pay

## 2022-03-10 ENCOUNTER — Observation Stay (HOSPITAL_COMMUNITY)
Admission: RE | Admit: 2022-03-10 | Discharge: 2022-03-11 | Disposition: A | Discharge status: Home / Self Care | Payer: BC Managed Care – PPO | Source: Ambulatory Visit | Attending: Neurological Surgery | Admitting: Neurological Surgery

## 2022-03-10 DIAGNOSIS — M4316 Spondylolisthesis, lumbar region: Secondary | ICD-10-CM | POA: Diagnosis not present

## 2022-03-10 DIAGNOSIS — E039 Hypothyroidism, unspecified: Secondary | ICD-10-CM | POA: Insufficient documentation

## 2022-03-10 DIAGNOSIS — Z01818 Encounter for other preprocedural examination: Principal | ICD-10-CM

## 2022-03-10 DIAGNOSIS — Z87891 Personal history of nicotine dependence: Secondary | ICD-10-CM | POA: Insufficient documentation

## 2022-03-10 DIAGNOSIS — M48061 Spinal stenosis, lumbar region without neurogenic claudication: Secondary | ICD-10-CM | POA: Diagnosis present

## 2022-03-10 HISTORY — PX: TRANSFORAMINAL LUMBAR INTERBODY FUSION W/ MIS 1 LEVEL: SHX6145

## 2022-03-10 LAB — TYPE AND SCREEN
ABO/RH(D): A POS
Antibody Screen: NEGATIVE

## 2022-03-10 LAB — ABO/RH: ABO/RH(D): A POS

## 2022-03-10 LAB — SURGICAL PCR SCREEN
MRSA, PCR: NEGATIVE
Staphylococcus aureus: NEGATIVE

## 2022-03-10 SURGERY — MINIMALLY INVASIVE (MIS) TRANSFORAMINAL LUMBAR INTERBODY FUSION (TLIF) 1 LEVEL
Anesthesia: General

## 2022-03-10 MED ORDER — ONDANSETRON 4 MG PO TBDP: 4.0000 mg | ORAL_TABLET | Freq: Three times a day (TID) | ORAL | Status: DC | PRN

## 2022-03-10 MED ORDER — FENTANYL CITRATE (PF) 100 MCG/2ML IJ SOLN
INTRAMUSCULAR | Status: AC
  Filled 2022-03-10: qty 2

## 2022-03-10 MED ORDER — MIDAZOLAM HCL 2 MG/2ML IJ SOLN
INTRAMUSCULAR | Status: DC | PRN
  Administered 2022-03-10: 2 mg via INTRAVENOUS

## 2022-03-10 MED ORDER — ROCURONIUM BROMIDE 10 MG/ML (PF) SYRINGE
PREFILLED_SYRINGE | INTRAVENOUS | Status: AC
  Filled 2022-03-10: qty 10

## 2022-03-10 MED ORDER — SODIUM CHLORIDE 0.9 % IV SOLN
250.0000 mL | INTRAVENOUS | Status: DC
  Administered 2022-03-10: 250 mL via INTRAVENOUS

## 2022-03-10 MED ORDER — HYDROMORPHONE HCL 1 MG/ML IJ SOLN: 1.0000 mg | INTRAMUSCULAR | Status: DC | PRN

## 2022-03-10 MED ORDER — LIDOCAINE-EPINEPHRINE 1 %-1:100000 IJ SOLN
INTRAMUSCULAR | Status: DC | PRN
  Administered 2022-03-10: 10 mL

## 2022-03-10 MED ORDER — ACETAMINOPHEN 10 MG/ML IV SOLN
1000.0000 mg | Freq: Once | INTRAVENOUS | Status: DC | PRN
  Administered 2022-03-10: 1000 mg via INTRAVENOUS

## 2022-03-10 MED ORDER — PHENOL 1.4 % MT LIQD: 1.0000 | OROMUCOSAL | Status: DC | PRN

## 2022-03-10 MED ORDER — PHENYLEPHRINE HCL-NACL 20-0.9 MG/250ML-% IV SOLN
INTRAVENOUS | Status: DC | PRN
  Administered 2022-03-10: 20 ug/min via INTRAVENOUS

## 2022-03-10 MED ORDER — TAMSULOSIN HCL 0.4 MG PO CAPS: 0.4000 mg | ORAL_CAPSULE | Freq: Every day | ORAL | Status: DC

## 2022-03-10 MED ORDER — LISDEXAMFETAMINE DIMESYLATE 70 MG PO CAPS
70.0000 mg | ORAL_CAPSULE | Freq: Every day | ORAL | Status: DC
  Administered 2022-03-11: 70 mg via ORAL
  Filled 2022-03-10: qty 1

## 2022-03-10 MED ORDER — OXYCODONE HCL 5 MG PO TABS: 5.0000 mg | ORAL_TABLET | ORAL | Status: DC | PRN

## 2022-03-10 MED ORDER — GELATIN ABSORBABLE MT POWD
OROMUCOSAL | Status: DC | PRN
  Administered 2022-03-10: 5 mL via TOPICAL

## 2022-03-10 MED ORDER — MIDAZOLAM HCL 2 MG/2ML IJ SOLN
INTRAMUSCULAR | Status: AC
  Filled 2022-03-10: qty 2

## 2022-03-10 MED ORDER — ROCURONIUM BROMIDE 10 MG/ML (PF) SYRINGE
PREFILLED_SYRINGE | INTRAVENOUS | Status: DC | PRN
  Administered 2022-03-10 (×2): 20 mg via INTRAVENOUS
  Administered 2022-03-10: 60 mg via INTRAVENOUS
  Administered 2022-03-10: 20 mg via INTRAVENOUS

## 2022-03-10 MED ORDER — SODIUM CHLORIDE 0.9% FLUSH: 3.0000 mL | Freq: Two times a day (BID) | INTRAVENOUS | Status: DC

## 2022-03-10 MED ORDER — PROPOFOL 10 MG/ML IV BOLUS
INTRAVENOUS | Status: DC | PRN
  Administered 2022-03-10: 150 mg via INTRAVENOUS

## 2022-03-10 MED ORDER — FENTANYL CITRATE (PF) 250 MCG/5ML IJ SOLN
INTRAMUSCULAR | Status: AC
  Filled 2022-03-10: qty 5

## 2022-03-10 MED ORDER — CEFAZOLIN SODIUM-DEXTROSE 2-4 GM/100ML-% IV SOLN
2.0000 g | Freq: Three times a day (TID) | INTRAVENOUS | Status: AC
  Administered 2022-03-10 – 2022-03-11 (×2): 2 g via INTRAVENOUS
  Filled 2022-03-10 (×2): qty 100

## 2022-03-10 MED ORDER — ACETAMINOPHEN 325 MG PO TABS
650.0000 mg | ORAL_TABLET | ORAL | Status: DC | PRN
  Administered 2022-03-10 – 2022-03-11 (×3): 650 mg via ORAL
  Filled 2022-03-10 (×3): qty 2

## 2022-03-10 MED ORDER — ONDANSETRON HCL 4 MG/2ML IJ SOLN
INTRAMUSCULAR | Status: AC
  Filled 2022-03-10: qty 2

## 2022-03-10 MED ORDER — OXYCODONE HCL 5 MG PO TABS
10.0000 mg | ORAL_TABLET | ORAL | Status: DC | PRN
  Administered 2022-03-10 – 2022-03-11 (×5): 10 mg via ORAL
  Filled 2022-03-10 (×5): qty 2

## 2022-03-10 MED ORDER — ACETAMINOPHEN 650 MG RE SUPP: 650.0000 mg | RECTAL | Status: DC | PRN

## 2022-03-10 MED ORDER — SODIUM CHLORIDE 0.9% FLUSH: 3.0000 mL | INTRAVENOUS | Status: DC | PRN

## 2022-03-10 MED ORDER — MENTHOL 3 MG MT LOZG: 1.0000 | LOZENGE | OROMUCOSAL | Status: DC | PRN

## 2022-03-10 MED ORDER — FENTANYL CITRATE (PF) 250 MCG/5ML IJ SOLN
INTRAMUSCULAR | Status: DC | PRN
  Administered 2022-03-10: 150 ug via INTRAVENOUS
  Administered 2022-03-10 (×2): 50 ug via INTRAVENOUS

## 2022-03-10 MED ORDER — DEXAMETHASONE SODIUM PHOSPHATE 10 MG/ML IJ SOLN
INTRAMUSCULAR | Status: AC
  Filled 2022-03-10: qty 2

## 2022-03-10 MED ORDER — CEFAZOLIN SODIUM-DEXTROSE 2-4 GM/100ML-% IV SOLN
2.0000 g | INTRAVENOUS | Status: AC
  Administered 2022-03-10: 2 g via INTRAVENOUS
  Filled 2022-03-10: qty 100

## 2022-03-10 MED ORDER — HYDROXYZINE HCL 50 MG/ML IM SOLN
50.0000 mg | Freq: Four times a day (QID) | INTRAMUSCULAR | Status: DC | PRN
Start: 2022-03-10 — End: 2022-03-11
  Administered 2022-03-10: 50 mg via INTRAMUSCULAR
  Filled 2022-03-10: qty 1

## 2022-03-10 MED ORDER — OXYCODONE HCL 5 MG/5ML PO SOLN: 5.0000 mg | Freq: Once | ORAL | Status: DC | PRN

## 2022-03-10 MED ORDER — DEXAMETHASONE SODIUM PHOSPHATE 10 MG/ML IJ SOLN
INTRAMUSCULAR | Status: DC | PRN
Start: 2022-03-10 — End: 2022-03-10
  Administered 2022-03-10: 10 mg via INTRAVENOUS

## 2022-03-10 MED ORDER — LIDOCAINE 2% (20 MG/ML) 5 ML SYRINGE
INTRAMUSCULAR | Status: DC | PRN
  Administered 2022-03-10: 60 mg via INTRAVENOUS

## 2022-03-10 MED ORDER — ACETAMINOPHEN 10 MG/ML IV SOLN
INTRAVENOUS | Status: AC
  Filled 2022-03-10: qty 100

## 2022-03-10 MED ORDER — PHENYLEPHRINE HCL-NACL 20-0.9 MG/250ML-% IV SOLN
INTRAVENOUS | Status: AC
  Filled 2022-03-10: qty 250

## 2022-03-10 MED ORDER — CHLORHEXIDINE GLUCONATE CLOTH 2 % EX PADS
6.0000 | MEDICATED_PAD | Freq: Once | CUTANEOUS | Status: DC
Start: 2022-03-10 — End: 2022-03-10

## 2022-03-10 MED ORDER — HYDROMORPHONE HCL 1 MG/ML IJ SOLN
0.2500 mg | INTRAMUSCULAR | Status: DC | PRN
  Administered 2022-03-10 (×4): 0.5 mg via INTRAVENOUS

## 2022-03-10 MED ORDER — DOCUSATE SODIUM 100 MG PO CAPS
100.0000 mg | ORAL_CAPSULE | Freq: Two times a day (BID) | ORAL | Status: DC
  Administered 2022-03-10 – 2022-03-11 (×2): 100 mg via ORAL
  Filled 2022-03-10 (×2): qty 1

## 2022-03-10 MED ORDER — 0.9 % SODIUM CHLORIDE (POUR BTL) OPTIME
TOPICAL | Status: DC | PRN
  Administered 2022-03-10: 1000 mL

## 2022-03-10 MED ORDER — ORAL CARE MOUTH RINSE: 15.0000 mL | Freq: Once | OROMUCOSAL | Status: AC

## 2022-03-10 MED ORDER — ONDANSETRON HCL 4 MG PO TABS: 4.0000 mg | ORAL_TABLET | Freq: Four times a day (QID) | ORAL | Status: DC | PRN

## 2022-03-10 MED ORDER — ONDANSETRON HCL 4 MG/2ML IJ SOLN: 4.0000 mg | Freq: Four times a day (QID) | INTRAMUSCULAR | Status: DC | PRN

## 2022-03-10 MED ORDER — FENTANYL CITRATE (PF) 100 MCG/2ML IJ SOLN
25.0000 ug | INTRAMUSCULAR | Status: DC | PRN
  Administered 2022-03-10 (×3): 50 ug via INTRAVENOUS

## 2022-03-10 MED ORDER — PHENYLEPHRINE 40 MCG/ML (10ML) SYRINGE FOR IV PUSH (FOR BLOOD PRESSURE SUPPORT)
PREFILLED_SYRINGE | INTRAVENOUS | Status: AC
  Filled 2022-03-10: qty 10

## 2022-03-10 MED ORDER — THROMBIN 5000 UNITS EX SOLR
CUTANEOUS | Status: AC
  Filled 2022-03-10: qty 5000

## 2022-03-10 MED ORDER — ONDANSETRON HCL 4 MG/2ML IJ SOLN
INTRAMUSCULAR | Status: DC | PRN
  Administered 2022-03-10: 4 mg via INTRAVENOUS

## 2022-03-10 MED ORDER — HYDROMORPHONE HCL 1 MG/ML IJ SOLN
INTRAMUSCULAR | Status: AC
  Filled 2022-03-10: qty 1

## 2022-03-10 MED ORDER — CHLORHEXIDINE GLUCONATE 0.12 % MT SOLN
15.0000 mL | Freq: Once | OROMUCOSAL | Status: AC
  Administered 2022-03-10: 15 mL via OROMUCOSAL
  Filled 2022-03-10: qty 15

## 2022-03-10 MED ORDER — CHLORHEXIDINE GLUCONATE CLOTH 2 % EX PADS: 6.0000 | MEDICATED_PAD | Freq: Once | CUTANEOUS | Status: DC

## 2022-03-10 MED ORDER — LEVOTHYROXINE SODIUM 125 MCG PO TABS
125.0000 ug | ORAL_TABLET | Freq: Every day | ORAL | Status: DC
  Administered 2022-03-11: 125 ug via ORAL
  Filled 2022-03-10: qty 1

## 2022-03-10 MED ORDER — OXYCODONE HCL 5 MG PO TABS: 5.0000 mg | ORAL_TABLET | Freq: Once | ORAL | Status: DC | PRN

## 2022-03-10 MED ORDER — CYCLOBENZAPRINE HCL 10 MG PO TABS
10.0000 mg | ORAL_TABLET | Freq: Three times a day (TID) | ORAL | Status: DC | PRN
  Administered 2022-03-10: 10 mg via ORAL
  Filled 2022-03-10: qty 1

## 2022-03-10 MED ORDER — LACTATED RINGERS IV SOLN: INTRAVENOUS | Status: DC

## 2022-03-10 MED ORDER — LIDOCAINE-EPINEPHRINE 1 %-1:100000 IJ SOLN
INTRAMUSCULAR | Status: AC
  Filled 2022-03-10: qty 1

## 2022-03-10 MED ORDER — ONDANSETRON HCL 4 MG/2ML IJ SOLN
4.0000 mg | Freq: Four times a day (QID) | INTRAMUSCULAR | Status: AC | PRN
  Administered 2022-03-10: 4 mg via INTRAVENOUS

## 2022-03-10 MED ORDER — PHENYLEPHRINE 40 MCG/ML (10ML) SYRINGE FOR IV PUSH (FOR BLOOD PRESSURE SUPPORT)
PREFILLED_SYRINGE | INTRAVENOUS | Status: DC | PRN
  Administered 2022-03-10 (×3): 80 ug via INTRAVENOUS

## 2022-03-10 MED ORDER — LIDOCAINE 2% (20 MG/ML) 5 ML SYRINGE
INTRAMUSCULAR | Status: AC
  Filled 2022-03-10: qty 5

## 2022-03-10 MED ORDER — POLYETHYLENE GLYCOL 3350 17 G PO PACK: 17.0000 g | PACK | Freq: Every day | ORAL | Status: DC | PRN

## 2022-03-10 SURGICAL SUPPLY — 64 items
BAG COUNTER SPONGE SURGICOUNT (BAG) ×3 IMPLANT
BAND RUBBER #18 3X1/16 STRL (MISCELLANEOUS) ×4 IMPLANT
BASKET BONE COLLECTION (BASKET) ×2 IMPLANT
BLADE CLIPPER SURG (BLADE) IMPLANT
BLADE SURG 11 STRL SS (BLADE) ×2 IMPLANT
BUR MATCHSTICK NEURO 3.0 LAGG (BURR) ×1 IMPLANT
BUR ROUND PRECISION 4.0 (BURR) ×2 IMPLANT
CAGE EXP CATALYFT 9 (Plate) ×1 IMPLANT
CNTNR URN SCR LID CUP LEK RST (MISCELLANEOUS) ×1 IMPLANT
CONT SPEC 4OZ STRL OR WHT (MISCELLANEOUS) ×1
COVER BACK TABLE 60X90IN (DRAPES) ×2 IMPLANT
DECANTER SPIKE VIAL GLASS SM (MISCELLANEOUS) ×1 IMPLANT
DERMABOND ADHESIVE PROPEN (GAUZE/BANDAGES/DRESSINGS) ×1
DERMABOND ADVANCED (GAUZE/BANDAGES/DRESSINGS) ×1
DERMABOND ADVANCED .7 DNX12 (GAUZE/BANDAGES/DRESSINGS) ×1 IMPLANT
DERMABOND ADVANCED .7 DNX6 (GAUZE/BANDAGES/DRESSINGS) IMPLANT
DRAPE C-ARM 42X72 X-RAY (DRAPES) ×2 IMPLANT
DRAPE C-ARMOR (DRAPES) ×2 IMPLANT
DRAPE LAPAROTOMY 100X72X124 (DRAPES) ×2 IMPLANT
DRAPE MICROSCOPE LEICA (MISCELLANEOUS) ×2 IMPLANT
DRAPE SURG 17X23 STRL (DRAPES) ×4 IMPLANT
ELECT BLADE 6.5 EXT (BLADE) ×2 IMPLANT
ELECT REM PT RETURN 9FT ADLT (ELECTROSURGICAL) ×2
ELECTRODE REM PT RTRN 9FT ADLT (ELECTROSURGICAL) ×1 IMPLANT
EXTENDER TAB GUIDE SV 5.5/6.0 (INSTRUMENTS) ×8 IMPLANT
GAUZE 4X4 16PLY ~~LOC~~+RFID DBL (SPONGE) ×1 IMPLANT
GAUZE SPONGE 4X4 12PLY STRL (GAUZE/BANDAGES/DRESSINGS) ×1 IMPLANT
GLOVE SURG LTX SZ7.5 (GLOVE) ×2 IMPLANT
GLOVE SURG UNDER POLY LF SZ7.5 (GLOVE) ×2 IMPLANT
GOWN STRL REUS W/ TWL LRG LVL3 (GOWN DISPOSABLE) ×1 IMPLANT
GOWN STRL REUS W/ TWL XL LVL3 (GOWN DISPOSABLE) IMPLANT
GOWN STRL REUS W/TWL 2XL LVL3 (GOWN DISPOSABLE) IMPLANT
GOWN STRL REUS W/TWL LRG LVL3 (GOWN DISPOSABLE) ×1
GOWN STRL REUS W/TWL XL LVL3 (GOWN DISPOSABLE)
GUIDEWIRE BLUNT NT 450 (WIRE) ×4 IMPLANT
HEMOSTAT POWDER KIT SURGIFOAM (HEMOSTASIS) ×2 IMPLANT
KIT BASIN OR (CUSTOM PROCEDURE TRAY) ×2 IMPLANT
KIT POSITION SURG JACKSON T1 (MISCELLANEOUS) ×2 IMPLANT
KIT TURNOVER KIT B (KITS) IMPLANT
NDL BEVEL TWO-PAK W/1PK (NEEDLE) IMPLANT
NDL HYPO 18GX1.5 BLUNT FILL (NEEDLE) IMPLANT
NDL SPNL 18GX3.5 QUINCKE PK (NEEDLE) IMPLANT
NEEDLE BEVEL TWO-PAK W/1PK (NEEDLE) ×2 IMPLANT
NEEDLE HYPO 18GX1.5 BLUNT FILL (NEEDLE) IMPLANT
NEEDLE HYPO 22GX1.5 SAFETY (NEEDLE) ×2 IMPLANT
NEEDLE SPNL 18GX3.5 QUINCKE PK (NEEDLE) ×2 IMPLANT
NS IRRIG 1000ML POUR BTL (IV SOLUTION) ×2 IMPLANT
PACK LAMINECTOMY NEURO (CUSTOM PROCEDURE TRAY) ×2 IMPLANT
PAD ARMBOARD 7.5X6 YLW CONV (MISCELLANEOUS) ×4 IMPLANT
ROD PERC CCM 5.5X35 (Rod) ×2 IMPLANT
SCREW MAS VOYAGER 6.5X40 (Screw) ×4 IMPLANT
SCREW SET 5.5/6.0MM SOLERA (Screw) ×4 IMPLANT
SPONGE T-LAP 4X18 ~~LOC~~+RFID (SPONGE) ×1 IMPLANT
SUT MNCRL AB 3-0 PS2 18 (SUTURE) ×2 IMPLANT
SUT VIC AB 0 CT1 18XCR BRD8 (SUTURE) IMPLANT
SUT VIC AB 0 CT1 8-18 (SUTURE)
SUT VIC AB 2-0 CP2 18 (SUTURE) ×3 IMPLANT
SYR 3ML LL SCALE MARK (SYRINGE) IMPLANT
TOWEL GREEN STERILE (TOWEL DISPOSABLE) ×2 IMPLANT
TOWEL GREEN STERILE FF (TOWEL DISPOSABLE) ×2 IMPLANT
TRAY FOL W/BAG SLVR 16FR STRL (SET/KITS/TRAYS/PACK) IMPLANT
TRAY FOLEY MTR SLVR 16FR STAT (SET/KITS/TRAYS/PACK) IMPLANT
TRAY FOLEY W/BAG SLVR 16FR LF (SET/KITS/TRAYS/PACK) ×1
WATER STERILE IRR 1000ML POUR (IV SOLUTION) ×2 IMPLANT

## 2022-03-10 NOTE — H&P (Signed)
Surgical H&P Update ? ?HPI: 61 y.o. with a history of low back and bilateral lower extremity pain. Workup showed canal stenosis with a grade 2 spondy at L4-5. No changes in health since they were last seen. Still having the above and wishes to proceed with surgery. ? ?PMHx:  ?Past Medical History:  ?Diagnosis Date  ? Hypothyroidism   ? Narcolepsy   ? ?FamHx: No family history on file. ?SocHx:  reports that she quit smoking about 2 years ago. Her smoking use included cigarettes. She does not have any smokeless tobacco history on file. She reports current alcohol use. She reports that she does not use drugs. ? ?Physical Exam: ?Strength 5/5 x4 and SILTx4  ? ?Assesment/Plan: ?61 y.o. woman with L4-5 stenosis and spondylolisthesis, here for L4-5 MIS TLIF and decompression. Risks, benefits, and alternatives discussed and the patient would like to continue with surgery. ? ?-OR today ?-3C post-op ? ?Judith Part, MD ?03/10/22 ?11:59 AM ? ?

## 2022-03-10 NOTE — Progress Notes (Signed)
Patient willing to accept blood as a last resort. Would like cell saving used first and albumin if needed.  ?

## 2022-03-10 NOTE — Anesthesia Procedure Notes (Addendum)
Procedure Name: Intubation ?Date/Time: 03/10/2022 12:22 PM ?Performed by: Bryson Corona, CRNA ?Pre-anesthesia Checklist: Patient identified, Emergency Drugs available, Suction available and Patient being monitored ?Patient Re-evaluated:Patient Re-evaluated prior to induction ?Oxygen Delivery Method: Circle System Utilized ?Preoxygenation: Pre-oxygenation with 100% oxygen ?Induction Type: IV induction ?Ventilation: Mask ventilation without difficulty ?Laryngoscope Size: Mac and 3 ?Grade View: Grade I ?Tube type: Oral ?Tube size: 7.0 mm ?Number of attempts: 1 ?Airway Equipment and Method: Stylet and Oral airway ?Placement Confirmation: ETT inserted through vocal cords under direct vision, positive ETCO2 and breath sounds checked- equal and bilateral ?Secured at: 22 cm ?Tube secured with: Tape ?Dental Injury: Teeth and Oropharynx as per pre-operative assessment  ?Comments: Intubated by Everlene Other SRNA ? ? ? ? ?

## 2022-03-10 NOTE — Op Note (Signed)
PATIENT: Joan Case ? ?DAY OF SURGERY: 03/10/22 ?  ?PRE-OPERATIVE DIAGNOSIS:  Lumbar stenosis with neurogenic claudication, lumbar spondylolisthesis ?  ?POST-OPERATIVE DIAGNOSIS:  Same ?  ?PROCEDURE:  L4-L5 minimally invasive transforaminal lumbar interbody fusion, L4-5 MIS laminectomy ?  ?SURGEON:  Surgeon(s) and Role: ?   [name], MD - Primary ?  ?ANESTHESIA: ETGA ?  ?BRIEF HISTORY: This is a 61 year old woman who presented with progressive low back and bilateral lower extremity pain. The patient was found to have stenosis worst at L4-5 with a mobile spondylolisthesis at that level. I therefore recommended minimally invasive decompression and TLIF at L4-5. This was discussed with the patient as well as risks, benefits, and alternatives and wished to proceed with surgery. ?  ?OPERATIVE DETAIL:  The patient was taken to the operating room and placed on the OR table in the prone position. A formal time out was performed with two patient identifiers and confirmed the operative site. Anesthesia was induced by the anesthesia team. The operative site was marked, hair was clipped with surgical clippers, the area was then prepped and draped in a sterile fashion.  ? ?Fluoroscopy was used to localize the surgical level. The pedicles were marked and used to create skin incisions bilaterally. With fluoro guidance, Jamshidi needles were used to guide K-wires into the bilateral L4 and L5 pedicles. The K wires were then secured with hemostats and attention turned to the TLIF. ? ?A MetRx tube was then docked to the left L4-5 facet through the same incision using fluoroscopy. A left L4-5 facetectomy was performed and the left L4 nerve root was decompressed along its entire course. The tube was wanded medially and an L4-5 laminectomy and decompression was performed, which was more than what was needed for instrumentation alone. This was continued contralaterally until reaching the contralateral foramen. The tube was  wanded back to the disc space ipsilaterally. The disc space was identified, incised, and a discectomy was performed in the standard fashion. The endplates were prepped, bone graft was packed into the disc space, and an expandable cage ([employer]) was packed with autograft and placed into the disc space with fluoroscopic confirmation. The tube was removed and hemostasis was obtained during its removal.  ? ?Using the previously placed K wires, a tap and then screw with tower were placed bilaterally at L4 and L5. A rod was sized and introduced on both sides, confirmed with fluoroscopy, then final tightened. Hemostasis was again confirmed for both incisions, they were copiously irrigated, and then closed in layers.  ?  ?EBL:  54mL ?  ?DRAINS: none ?  ?SPECIMENS: none ?  ?[name], MD ?03/10/22 ?12:25 PM ? ?

## 2022-03-10 NOTE — Anesthesia Preprocedure Evaluation (Signed)
Anesthesia Evaluation  ?Patient identified by MRN, date of birth, ID band ?Patient awake ? ? ? ?Reviewed: ?Allergy & Precautions, H&P , NPO status , Patient's Chart, lab work & pertinent test results ? ?Airway ?Mallampati: II ? ? ?Neck ROM: full ? ? ? Dental ?  ?Pulmonary ?former smoker,  ?  ?breath sounds clear to auscultation ? ? ? ? ? ? Cardiovascular ?negative cardio ROS ? ? ?Rhythm:regular Rate:Normal ? ? ?  ?Neuro/Psych ?  ? GI/Hepatic ?  ?Endo/Other  ?Hypothyroidism  ? Renal/GU ?  ? ?  ?Musculoskeletal ? ? Abdominal ?  ?Peds ? Hematology ?  ?Anesthesia Other Findings ? ? Reproductive/Obstetrics ? ?  ? ? ? ? ? ? ? ? ? ? ? ? ? ?  ?  ? ? ? ? ? ? ? ? ?Anesthesia Physical ?Anesthesia Plan ? ?ASA: 2 ? ?Anesthesia Plan: General  ? ?Post-op Pain Management:   ? ?Induction: Intravenous ? ?PONV Risk Score and Plan: 3 and Ondansetron, Dexamethasone, Midazolam and Treatment may vary due to age or medical condition ? ?Airway Management Planned: Oral ETT ? ?Additional Equipment:  ? ?Intra-op Plan:  ? ?Post-operative Plan: Extubation in OR ? ?Informed Consent: I have reviewed the patients History and Physical, chart, labs and discussed the procedure including the risks, benefits and alternatives for the proposed anesthesia with the patient or authorized representative who has indicated his/her understanding and acceptance.  ? ? ? ?Dental advisory given ? ?Plan Discussed with: CRNA, Anesthesiologist and Surgeon ? ?Anesthesia Plan Comments:   ? ? ? ? ? ? ?Anesthesia Quick Evaluation ? ?

## 2022-03-10 NOTE — Anesthesia Postprocedure Evaluation (Signed)
Anesthesia Post Note ? ?Patient: Joan Case ? ?Procedure(s) Performed: Lumbar Four- Five Minimally Invasive Transforaminal Lumbar Interbody Fusion with metrx ? ?  ? ?Patient location during evaluation: PACU ?Anesthesia Type: General ?Level of consciousness: awake ?Pain management: pain level controlled ?Vital Signs Assessment: post-procedure vital signs reviewed and stable ?Respiratory status: spontaneous breathing, nonlabored ventilation, respiratory function stable and patient connected to nasal cannula oxygen ?Cardiovascular status: blood pressure returned to baseline and stable ?Postop Assessment: no apparent nausea or vomiting ?Anesthetic complications: no ? ? ?No notable events documented. ? ?Last Vitals:  ?Vitals:  ? 03/10/22 1720 03/10/22 1917  ?BP: 140/87 137/76  ?Pulse: 63 66  ?Resp: 18 18  ?Temp:  36.4 ?C  ?SpO2: 99% 100%  ?  ?Last Pain:  ?Vitals:  ? 03/10/22 2000  ?TempSrc:   ?PainSc: 4   ? ? ?  ?  ?  ?  ?  ?  ? ?Tuff Cl[place] P Quinesha Selinger ? ? ? ? ?

## 2022-03-10 NOTE — Transfer of Care (Signed)
Immediate Anesthesia Transfer of Care Note ? ?Patient: Joan Case ? ?Procedure(s) Performed: Lumbar Four- Five Minimally Invasive Transforaminal Lumbar Interbody Fusion with metrx ? ?Patient Location: PACU ? ?Anesthesia Type:General ? ?Level of Consciousness: drowsy and patient cooperative ? ?Airway & Oxygen Therapy: Patient Spontanous Breathing and Patient connected to nasal cannula oxygen ? ?Post-op Assessment: Report given to RN and Post -op Vital signs reviewed and stable ? ?Post vital signs: Reviewed and stable ? ?Last Vitals:  ?Vitals Value Taken Time  ?BP    ?Temp    ?Pulse    ?Resp    ?SpO2    ? ? ?Last Pain:  ?Vitals:  ? 03/10/22 0942  ?TempSrc:   ?PainSc: 4   ?   ? ?Patients Stated Pain Goal: 0 (03/10/22 4818) ? ?Complications: No notable events documented. ?

## 2022-03-11 ENCOUNTER — Encounter (HOSPITAL_COMMUNITY): Payer: Self-pay | Admitting: Neurological Surgery

## 2022-03-11 DIAGNOSIS — M48061 Spinal stenosis, lumbar region without neurogenic claudication: Secondary | ICD-10-CM | POA: Diagnosis not present

## 2022-03-11 MED ORDER — OXYCODONE-ACETAMINOPHEN 5-325 MG PO TABS: 1.0000 | ORAL_TABLET | Freq: Four times a day (QID) | ORAL | 0 refills | Status: DC | PRN

## 2022-03-11 MED ORDER — CYCLOBENZAPRINE HCL 10 MG PO TABS: 10.0000 mg | ORAL_TABLET | Freq: Three times a day (TID) | ORAL | 2 refills | Status: DC | PRN

## 2022-03-11 NOTE — TOC Transition Note (Signed)
Transition of Care (TOC) - CM/SW Discharge Note ? ? ?Patient Details  ?Name: Joan Case ?MRN: [id number] ?Date of Birth: 1961/05/02 ? ?Transition of Care (TOC) CM/SW Contact:  ?Carles Collet, RN ?Phone Number: ?03/11/2022, 9:29 AM ? ? ?Clinical Narrative:    ? ?Transition of Care (TOC) Screening Note ? ? ?Patient Details  ?Name: Joan Case ?Date of Birth: [birth year] ? ? ?Transition of Care (TOC) CM/SW Contact:    ?Carles Collet, RN ?Phone Number: ?03/11/2022, 9:29 AM ? ? ? ?Transition of Care Department [facility]) has reviewed patient and no TOC needs have been identified at this time.  ?  ? ? ? ?  ?  ? ? ?Patient Goals and CMS Choice ?  ?  ?  ? ?Discharge Placement ?  ?           ?  ?  ?  ?  ? ?Discharge Plan and Services ?  ?  ?           ?  ?  ?  ?  ?  ?  ?  ?  ?  ?  ? ?Social Determinants of Health (SDOH) Interventions ?  ? ? ?Readmission Risk Interventions ?   ? View : No data to display.  ?  ?  ?  ? ? ? ? ? ?

## 2022-03-11 NOTE — Progress Notes (Signed)
Patient alert and oriented, mae's well, voiding adequate amount of urine, swallowing without difficulty, no c/o pain at time of discharge. Patient discharged home with family. Script and discharged instructions given to patient. Patient and family stated understanding of instructions given. Patient has an appointment with Dr Maurice Small in 2 weeks. Patient awaiting ride home ?

## 2022-03-11 NOTE — Progress Notes (Signed)
Neurosurgery Service ?Progress Note ? ?Subjective: No acute events overnight. Preop radicular symptoms gone, back pain minimal, incisional pain impressively minimal, no new weakness / numbness / paresthesias ? ?Objective: ?Vitals:  ? 03/10/22 1720 03/10/22 1917 03/10/22 2311 03/11/22 0323  ?BP: 140/87 137/76 112/71 121/64  ?Pulse: 63 66 67 74  ?Resp: 18 18 20 18   ?Temp:  97.6 ?F (36.4 ?C) 97.7 ?F (36.5 ?C) (!) 97.5 ?F (36.4 ?C)  ?TempSrc:  Oral Oral Oral  ?SpO2: 99% 100% 100% 100%  ?Weight:      ?Height:      ? ? ?Physical Exam: ?Strength 5/5 x4 and SILTx4  ? ?Assessment & Plan: ?61 y.o. woman s/p L4-5 MIS decompression and TLIF for mobile spondy w/ severe canal stenosis, recovering well. ? ?-discharge home today ? ?77 Joan Case  ?03/11/22 ?6:36 AM ? ?

## 2022-03-11 NOTE — Evaluation (Signed)
Physical Therapy Evaluation and Discharge ?Patient Details ?Name: Joan Case ?MRN: [id number] ?DOB: [birth year] ?Today's Date: 03/11/2022 ? ?History of Present Illness ? Pt is a 61 y/o F who presents s/p L4-L5 TLIF on 03/10/2022.  PMH includes: Hypothyroidism, Narcolepsy  ?Clinical Impression ? Patient evaluated by Physical Therapy with no further acute PT needs identified. All education has been completed and the patient has no further questions. Pt was able to demonstrate transfers and ambulation with gross modified independence and no AD. Pt was educated on precautions, positioning recommendations, appropriate activity progression, and car transfer. See below for any follow-up Physical Therapy or equipment needs. PT is signing off. Thank you for this referral.    ?   ? ?Recommendations for follow up therapy are one component of a multi-disciplinary discharge planning process, led by the attending physician.  Recommendations may be updated based on patient status, additional functional criteria and insurance authorization. ? ?Follow Up Recommendations No PT follow up ? ?  ?Assistance Recommended at Discharge PRN  ?Patient can return home with the following ? Assist for transportation;Assistance with cooking/housework ? ?  ?Equipment Recommendations None recommended by PT  ?Recommendations for Other Services ?    ?  ?Functional Status Assessment Patient has had a recent decline in their functional status and demonstrates the ability to make significant improvements in function in a reasonable and predictable amount of time.  ? ?  ?Precautions / Restrictions Precautions ?Precautions: Back ?Precaution Booklet Issued: Yes (comment) ?Precaution Comments: No brace ?Restrictions ?Weight Bearing Restrictions: No  ? ?  ? ?Mobility ? Bed Mobility ?Overal bed mobility: Modified Independent ?  ?  ?  ?  ?  ?  ?  ?  ? ?Transfers ?Overall transfer level: Independent ?Equipment used: None ?  ?  ?  ?  ?  ?  ?  ?  ?   ? ?Ambulation/Gait ?Ambulation/Gait assistance: Modified independent (Device/Increase time) ?Gait Distance (Feet): 500 Feet ?Assistive device: None ?Gait Pattern/deviations: WFL(Within Functional Limits) ?Gait velocity: Decreased ?Gait velocity interpretation: 1.31 - 2.62 ft/sec, indicative of limited community ambulator ?  ?General Gait Details: Mildly decreased gait speed but overall no unsteadiness or LOB noted. Good posture throughout. ? ?Stairs ?Stairs: Yes ?Stairs assistance: Modified independent (Device/Increase time) ?Stair Management: One rail Right, Alternating pattern, Forwards ?Number of Stairs: 10 ?General stair comments: VC's for general safety. No unsteadiness noted. ? ?Wheelchair Mobility ?  ? ?Modified Rankin (Stroke Patients Only) ?  ? ?  ? ?Balance Overall balance assessment: Modified Independent ?  ?  ?  ?  ?  ?  ?  ?  ?  ?  ?  ?  ?  ?  ?  ?  ?  ?  ?   ? ? ? ?Pertinent Vitals/Pain Pain Assessment ?Pain Assessment: No/denies pain  ? ? ?Home Living Family/patient expects to be discharged to:: Private residence ?Living Arrangements: Spouse/significant other ?Available Help at Discharge: Family;Available 24 hours/day ?Type of Home: House ?Home Access: Stairs to enter ?Entrance Stairs-Rails: None ?Entrance Stairs-Number of Steps: 2 ?  ?Home Layout: One level ?Home Equipment: None ?   ?  ?Prior Function Prior Level of Function : Independent/Modified Independent ?  ?  ?  ?  ?  ?  ?  ?  ?  ? ? ?Hand Dominance  ? Dominant Hand: Right ? ?  ?Extremity/Trunk Assessment  ? Upper Extremity Assessment ?Upper Extremity Assessment: Overall WFL for tasks assessed ?  ? ?Lower Extremity Assessment ?Lower Extremity Assessment: Overall  WFL for tasks assessed ?  ? ?Cervical / Trunk Assessment ?Cervical / Trunk Assessment: Back Surgery  ?Communication  ? Communication: No difficulties  ?Cognition Arousal/Alertness: Awake/alert ?Behavior During Therapy: [facility] for tasks assessed/performed ?Overall Cognitive Status: Within  Functional Limits for tasks assessed ?  ?  ?  ?  ?  ?  ?  ?  ?  ?  ?  ?  ?  ?  ?  ?  ?  ?  ?  ? ?  ?General Comments   ? ?  ?Exercises    ? ?Assessment/Plan  ?  ?PT Assessment Patient does not need any further PT services  ?PT Problem List   ? ?   ?  ?PT Treatment Interventions     ? ?PT Goals (Current goals can be found in the Care Plan section)  ?Acute Rehab PT Goals ?Patient Stated Goal: Home today, back to her horses and playing raquetball ?PT Goal Formulation: All assessment and education complete, DC therapy ? ?  ?Frequency   ?  ? ? ?Co-evaluation   ?  ?  ?  ?  ? ? ?  ?AM-PAC PT "6 Clicks" Mobility  ?Outcome Measure Help needed turning from your back to your side while in a flat bed without using bedrails?: None ?Help needed moving from lying on your back to sitting on the side of a flat bed without using bedrails?: None ?Help needed moving to and from a bed to a chair (including a wheelchair)?: None ?Help needed standing up from a chair using your arms (e.g., wheelchair or bedside chair)?: None ?Help needed to walk in hospital room?: None ?Help needed climbing 3-5 steps with a railing? : None ?6 Click Score: 24 ? ?  ?End of Session Equipment Utilized During Treatment: Gait belt ?Activity Tolerance: Patient tolerated treatment well ?Patient left: in chair;with call bell/phone within reach;with family/visitor present ?Nurse Communication: Mobility status ?PT Visit Diagnosis: Unsteadiness on feet (R26.81);Pain ?Pain - part of body:  (back) ?  ? ?Time: [id number] ?PT Time Calculation (min) (ACUTE ONLY): 12 min ? ? ?Charges:   PT Evaluation ?$PT Eval Low Complexity: 1 Low ?  ?  ?   ? ? ?Joan Case, PT, DPT ?Acute Rehabilitation Services ?Pager: [phone] ?Office: [phone]  ? ?Thelma Comp ?03/11/2022, 11:52 AM ? ?

## 2022-03-11 NOTE — Evaluation (Signed)
Occupational Therapy Evaluation ?Patient Details ?Name: Joan Case ?MRN: [id number] ?DOB: 06-13-1961 ?Today's Date: 03/11/2022 ? ? ?History of Present Illness 61 yo F adm for scheduled PLIF.  PMH includes: Hypothyroidism, Narcolepsy  ? ?Clinical Impression ?  ?Patient admitted for the above diagnosis.  Currently she is at or near her baseline for in room mobility/toileting, and ADL completion at a sit/stand level.  No further needs in the acute setting.  All precautions reviewed, and all questions answered.  No further needs in the acute setting.     ?   ? ?Recommendations for follow up therapy are one component of a multi-disciplinary discharge planning process, led by the attending physician.  Recommendations may be updated based on patient status, additional functional criteria and insurance authorization.  ? ?Follow Up Recommendations ? No OT follow up  ?  ?Assistance Recommended at Discharge Set up Supervision/Assistance  ?Patient can return home with the following   ? ?  ?Functional Status Assessment ? Patient has not had a recent decline in their functional status  ?Equipment Recommendations ? None recommended by OT  ?  ?Recommendations for Other Services   ? ? ?  ?Precautions / Restrictions Precautions ?Precautions: Back ?Precaution Booklet Issued: Yes (comment) ?Precaution Comments: No brace ?Restrictions ?Weight Bearing Restrictions: No  ? ?  ? ?Mobility Bed Mobility ?Overal bed mobility: Modified Independent ?  ?  ?  ?  ?  ?  ?  ?  ? ?Transfers ?Overall transfer level: Independent ?  ?  ?  ?  ?  ?  ?  ?  ?  ?  ? ?  ?Balance Overall balance assessment: No apparent balance deficits (not formally assessed) ?  ?  ?  ?  ?  ?  ?  ?  ?  ?  ?  ?  ?  ?  ?  ?  ?  ?  ?   ? ?ADL either performed or assessed with clinical judgement  ? ?ADL Overall ADL's : At baseline ?  ?  ?  ?  ?  ?  ?  ?  ?  ?  ?  ?  ?  ?  ?  ?  ?  ?  ?  ?   ? ? ? ?Vision Patient Visual Report: No change from baseline ?   ?   ?Perception  Perception ?Perception: Not tested ?  ?Praxis Praxis ?Praxis: Not tested ?  ? ?Pertinent Vitals/Pain Pain Assessment ?Pain Assessment: Faces ?Faces Pain Scale: Hurts a little bit ?Pain Location: incision ?Pain Descriptors / Indicators: Tender ?Pain Intervention(s): Monitored during session  ? ? ? ?Hand Dominance Right ?  ?Extremity/Trunk Assessment Upper Extremity Assessment ?Upper Extremity Assessment: Overall WFL for tasks assessed ?  ?  ?  ?Cervical / Trunk Assessment ?Cervical / Trunk Assessment: Back Surgery ?  ?Communication Communication ?Communication: No difficulties ?  ?Cognition Arousal/Alertness: Awake/alert ?Behavior During Therapy: [facility] for tasks assessed/performed ?Overall Cognitive Status: Within Functional Limits for tasks assessed ?  ?  ?  ?  ?  ?  ?  ?  ?  ?  ?  ?  ?  ?  ?  ?  ?  ?  ?  ?General Comments    ? ?  ?Exercises   ?  ?Shoulder Instructions    ? ? ?Home Living Family/patient expects to be discharged to:: Private residence ?Living Arrangements: Spouse/significant other ?Available Help at Discharge: Family;Available 24 hours/day ?Type of Home: House ?Home Access: Stairs to  enter ?Entrance Stairs-Number of Steps: 2 ?Entrance Stairs-Rails: None ?Home Layout: One level ?  ?  ?Bathroom Shower/Tub: Walk-in shower ?  ?Bathroom Toilet: Standard ?Bathroom Accessibility: Yes ?How Accessible: Accessible via walker ?Home Equipment: None ?  ?  ?  ? ?  ?Prior Functioning/Environment Prior Level of Function : Independent/Modified Independent ?  ?  ?  ?  ?  ?  ?  ?  ?  ? ?  ?  ?OT Problem List: Pain ?  ?   ?OT Treatment/Interventions:    ?  ?OT Goals(Current goals can be found in the care plan section) Acute Rehab OT Goals ?Patient Stated Goal: Return home ?OT Goal Formulation: With patient ?Time For Goal Achievement: 03/12/22 ?Potential to Achieve Goals: Good  ?OT Frequency:   ?  ? ?Co-evaluation   ?  ?  ?  ?  ? ?  ?AM-PAC OT "6 Clicks" Daily Activity     ?Outcome Measure Help from another person eating  meals?: None ?Help from another person taking care of personal grooming?: None ?Help from another person toileting, which includes using toliet, bedpan, or urinal?: None ?Help from another person bathing (including washing, rinsing, drying)?: None ?Help from another person to put on and taking off regular upper body clothing?: None ?Help from another person to put on and taking off regular lower body clothing?: None ?6 Click Score: 24 ?  ?End of Session Nurse Communication: Mobility status ? ?Activity Tolerance: Patient tolerated treatment well ?Patient left: in bed;with family/visitor present ? ?OT Visit Diagnosis: Pain  ?              ?Time: 4235-3614 ?OT Time Calculation (min): 19 min ?Charges:  OT General Charges ?$OT Visit: 1 Visit ?OT Evaluation ?$OT Eval Moderate Complexity: 1 Mod ? ?03/19/2022 ? ?RP, OTR/L ? ?Acute Rehabilitation Services ? ?Office:  [phone] ? ? ?Mikelle Myrick D Alaija Ruble ?03/11/2022, 9:07 AM ?

## 2022-03-11 NOTE — Evaluation (Signed)
Occupational Therapy Evaluation ?Patient Details ?Name: Joan Case ?MRN: [id number] ?DOB: 30-Jan-1961 ?Today's Date: 03/11/2022 ? ? ?History of Present Illness 61 yo F adm for scheduled PLIF.  PMH includes: Hypothyroidism, Narcolepsy  ? ?Clinical Impression ?  ?Patient already dressed this am, able to demonstrate figure four for ADL completion.  No deficits noted with bed mobility, or in room mobility.  All precautions reviewed, patient with good understanding and no questions.  The patient will have assist as needed, and no further OT needs identified.    ?   ? ?Recommendations for follow up therapy are one component of a multi-disciplinary discharge planning process, led by the attending physician.  Recommendations may be updated based on patient status, additional functional criteria and insurance authorization.  ? ?Follow Up Recommendations ? No OT follow up  ?  ?Assistance Recommended at Discharge Set up Supervision/Assistance  ?Patient can return home with the following   ? ?  ?Functional Status Assessment ? Patient has not had a recent decline in their functional status  ?Equipment Recommendations ? None recommended by OT  ?  ?Recommendations for Other Services   ? ? ?  ?Precautions / Restrictions Precautions ?Precautions: Back ?Precaution Booklet Issued: Yes (comment) ?Precaution Comments: No brace ?Restrictions ?Weight Bearing Restrictions: No  ? ?  ? ?Mobility Bed Mobility ?Overal bed mobility: Modified Independent ?  ?  ?  ?  ?  ?  ?  ?  ? ?Transfers ?Overall transfer level: Independent ?  ?  ?  ?  ?  ?  ?  ?  ?  ?  ? ?  ?Balance Overall balance assessment: No apparent balance deficits (not formally assessed) ?  ?  ?  ?  ?  ?  ?  ?  ?  ?  ?  ?  ?  ?  ?  ?  ?  ?  ?   ? ?ADL either performed or assessed with clinical judgement  ? ?ADL Overall ADL's : At baseline ?  ?  ?  ?  ?  ?  ?  ?  ?  ?  ?  ?  ?  ?  ?  ?  ?  ?  ?  ?   ? ? ? ?Vision Patient Visual Report: No change from baseline ?   ?   ?Perception  Perception ?Perception: Not tested ?  ?Praxis Praxis ?Praxis: Not tested ?  ? ?Pertinent Vitals/Pain Pain Assessment ?Pain Assessment: Faces ?Faces Pain Scale: Hurts a little bit ?Pain Location: incision ?Pain Descriptors / Indicators: Tender ?Pain Intervention(s): Monitored during session  ? ? ? ?Hand Dominance Right ?  ?Extremity/Trunk Assessment Upper Extremity Assessment ?Upper Extremity Assessment: Overall WFL for tasks assessed ?  ?  ?  ?Cervical / Trunk Assessment ?Cervical / Trunk Assessment: Back Surgery ?  ?Communication Communication ?Communication: No difficulties ?  ?Cognition Arousal/Alertness: Awake/alert ?Behavior During Therapy: [facility] for tasks assessed/performed ?Overall Cognitive Status: Within Functional Limits for tasks assessed ?  ?  ?  ?  ?  ?  ?  ?  ?  ?  ?  ?  ?  ?  ?  ?  ?  ?  ?  ? ?Home Living Family/patient expects to be discharged to:: Private residence ?Living Arrangements: Spouse/significant other ?Available Help at Discharge: Family;Available 24 hours/day ?Type of Home: House ?Home Access: Stairs to enter ?Entrance Stairs-Number of Steps: 2 ?Entrance Stairs-Rails: None ?Home Layout: One level ?  ?  ?Bathroom Shower/Tub: Walk-in shower ?  ?  Bathroom Toilet: Standard ?Bathroom Accessibility: Yes ?How Accessible: Accessible via walker ?Home Equipment: None ?  ?  ?  ? ?  ?Prior Functioning/Environment Prior Level of Function : Independent/Modified Independent ?  ?  ?  ?  ?  ?  ?  ?  ?  ? ?  ?  ?OT Problem List: Pain ?  ?   ?OT Treatment/Interventions:    ?  ?OT Goals(Current goals can be found in the care plan section) Acute Rehab OT Goals ?Patient Stated Goal: Return home ?OT Goal Formulation: With patient ?Time For Goal Achievement: 03/12/22 ?Potential to Achieve Goals: Good  ?OT Frequency:   ?  ? ?Co-evaluation   ?  ?  ?  ?  ? ?  ?AM-PAC OT "6 Clicks" Daily Activity     ?Outcome Measure Help from another person eating meals?: None ?Help from another person taking care of personal  grooming?: None ?Help from another person toileting, which includes using toliet, bedpan, or urinal?: None ?Help from another person bathing (including washing, rinsing, drying)?: None ?Help from another person to put on and taking off regular upper body clothing?: None ?Help from another person to put on and taking off regular lower body clothing?: None ?6 Click Score: 24 ?  ?End of Session Nurse Communication: Mobility status ? ?Activity Tolerance: Patient tolerated treatment well ?Patient left: in bed;with family/visitor present ? ?OT Visit Diagnosis: Pain  ?              ?Time: 9563-8756 ?OT Time Calculation (min): 19 min ?Charges:  OT General Charges ?$OT Visit: 1 Visit ?OT Evaluation ?$OT Eval Moderate Complexity: 1 Mod ? ?03/11/2022 ? ?RP, OTR/L ? ?Acute Rehabilitation Services ? ?Office:  [phone] ? ? ?Martha Soltys D Blaise Grieshaber ?03/11/2022, 9:09 AM ?

## 2022-03-11 NOTE — Discharge Summary (Signed)
Discharge Summary ? ?Date of Admission: 03/10/2022 ? ?Date of Discharge: 03/11/22 ? ?Attending Physician: Autumn Patty, MD ? ?Hospital Course: Patient was admitted following an uncomplicated L4-5 MIS decompression and TLIF. They were recovered in PACU and transferred to [facility]. Their preop symptoms were completely resolved, their hospital course was uncomplicated and the patient was discharged home on 03/11/22. They will follow up in clinic with me in clinic in 2 weeks. ? ?Neurologic exam at discharge:  ?Strength 5/5 x4 and SILTx4  ? 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 ?[name], MD ?03/11/22 ?6:37 AM ? ? ?

## 2022-03-16 MED FILL — Sodium Chloride IV Soln 0.9%: INTRAVENOUS | Qty: 1000 | Status: AC

## 2022-03-16 MED FILL — Heparin Sodium (Porcine) Inj 1000 Unit/ML: INTRAMUSCULAR | Qty: 30 | Status: AC

## 2022-04-29 ENCOUNTER — Ambulatory Visit (INDEPENDENT_AMBULATORY_CARE_PROVIDER_SITE_OTHER): Payer: BC Managed Care – PPO | Admitting: Orthopaedic Surgery

## 2022-04-29 DIAGNOSIS — M25562 Pain in left knee: Secondary | ICD-10-CM | POA: Diagnosis not present

## 2022-04-29 DIAGNOSIS — G8929 Other chronic pain: Secondary | ICD-10-CM | POA: Diagnosis not present

## 2022-04-29 MED ORDER — LIDOCAINE HCL 1 % IJ SOLN
3.0000 mL | INTRAMUSCULAR | Status: AC | PRN
  Administered 2022-04-29: 3 mL

## 2022-04-29 MED ORDER — METHYLPREDNISOLONE ACETATE 40 MG/ML IJ SUSP
40.0000 mg | INTRAMUSCULAR | Status: AC | PRN
  Administered 2022-04-29: 40 mg via INTRA_ARTICULAR

## 2022-04-29 NOTE — Progress Notes (Signed)
? ?Office Visit Note ?  ?Patient: Joan Case           ?Date of Birth: 07-05-1961           ?MRN: [id number] ?Visit Date: 04/29/2022 ?             ?Requested by: No referring provider defined for this encounter. ?PCP: Patient, No Pcp Per (Inactive) ? ? ?Assessment & Plan: ?Visit Diagnoses:  ?1. Chronic pain of left knee   ? ? ?Plan: Per the patient's request I did provide a steroid injection in her left knee and I did feel this was reasonable considering that the last injection helped for so long.  She can follow-up as needed.  However, if she experiences continued pain in the left knee we can always see her back.  If she does come back for follow-up we will need standing AP and lateral that left knee.  All questions and concerns were answered and addressed. ? ?Follow-Up Instructions: Return if symptoms worsen or fail to improve.  ? ?Orders:  ?Orders Placed This Encounter  ?Procedures  ? Large Joint Inj  ? ?No orders of the defined types were placed in this encounter. ? ? ? ? Procedures: ?Large Joint Inj on 04/29/2022 9:21 AM ?Indications: diagnostic evaluation and pain ?Details: 22 G 1.5 in needle, superolateral approach ? ?Arthrogram: No ? ?Medications: 3 mL lidocaine 1 %; 40 mg methylPREDNISolone acetate 40 MG/ML ?Outcome: tolerated well, no immediate complications ?Procedure, treatment alternatives, risks and benefits explained, specific risks discussed. Consent was given by the patient. Immediately prior to procedure a time out was called to verify the correct patient, procedure, equipment, support staff and site/side marked as required. Patient was prepped and draped in the usual sterile fashion.  ? ? ? ? ?Clinical Data: ?No additional findings. ? ? ?Subjective: ?Chief Complaint  ?Patient presents with  ? Left Knee - Pain  ?The patient is somewhat I last saw in July 2020.  She was having left knee pain at the time and we placed a steroid injection in the left knee.  She said that is helped for a long  period time and now after having recent lumbar spine surgery and March of this year, that is helped her posture and has taken some pressure off of her knee.  She comes in today requesting a steroid injection in her left knee since it has been getting close to 3 years since she had had an injection in that left knee.  She denies any other acute changes in her medical status.  She is not a diabetic.  She denies any locking catching the knee.  It is mostly medial joint line there is a source of her pain with the left knee. ? ?HPI ? ?Review of Systems ?There is no listed fever, chills, nausea, vomiting ? ?Objective: ?Vital Signs: There were no vitals taken for this visit. ? ?Physical Exam ?She is alert and orient x3 and in no acute distress ?Ortho Exam ?Examination of her left knee does show varus malalignment that is correctable.  There is no effusion but there is medial joint line tenderness and some patellofemoral crepitation throughout the range of motion of her left knee. ?Specialty Comments:  ?No specialty comments available. ? ?Imaging: ?No results found. ? ? ?PMFS History: ?Patient Active Problem List  ? Diagnosis Date Noted  ? Spondylolisthesis of lumbar region 03/10/2022  ? ?Past Medical History:  ?Diagnosis Date  ? Hypothyroidism   ? Narcolepsy   ?  ?  No family history on file.  ?Past Surgical History:  ?Procedure Laterality Date  ? ABDOMINAL HYSTERECTOMY  11/1992  ? CESAREAN SECTION  1993  ? TRANSFORAMINAL LUMBAR INTERBODY FUSION W/ MIS 1 LEVEL N/A 03/10/2022  ? Procedure: Lumbar Four- Five Minimally Invasive Transforaminal Lumbar Interbody Fusion with metrx;  Surgeon: [name], MD;  Location: [facility] OR;  Service: Neurosurgery;  Laterality: N/A;  Lumbar Four- Five Minimally Invasive Transforaminal Lumbar Interbody Fusion with metrx  ? ?Social History  ? ?Occupational History  ? Not on file  ?Tobacco Use  ? Smoking status: Former  ?  Types: Cigarettes  ?  Quit date: 2021  ?  Years since quitting: 2.3  ?  Smokeless tobacco: Not on file  ?Vaping Use  ? Vaping Use: Some days  ?Substance and Sexual Activity  ? Alcohol use: Yes  ?  Comment: occasionally  ? Drug use: Never  ? Sexual activity: Not on file  ? ? ? ? ? ? ?

## 2024-01-13 ENCOUNTER — Ambulatory Visit: Payer: BC Managed Care – PPO | Admitting: Physician Assistant

## 2024-01-24 ENCOUNTER — Ambulatory Visit: Payer: Federal, State, Local not specified - PPO | Admitting: Physician Assistant

## 2024-01-24 ENCOUNTER — Other Ambulatory Visit (INDEPENDENT_AMBULATORY_CARE_PROVIDER_SITE_OTHER): Payer: Federal, State, Local not specified - PPO

## 2024-01-24 DIAGNOSIS — G8929 Other chronic pain: Secondary | ICD-10-CM | POA: Diagnosis not present

## 2024-01-24 DIAGNOSIS — M25561 Pain in right knee: Secondary | ICD-10-CM

## 2024-01-24 DIAGNOSIS — M25562 Pain in left knee: Secondary | ICD-10-CM

## 2024-01-24 MED ORDER — METHYLPREDNISOLONE ACETATE 40 MG/ML IJ SUSP
40.0000 mg | INTRAMUSCULAR | Status: AC | PRN
  Administered 2024-01-24: 40 mg via INTRA_ARTICULAR

## 2024-01-24 MED ORDER — LIDOCAINE HCL 1 % IJ SOLN
3.0000 mL | INTRAMUSCULAR | Status: AC | PRN
  Administered 2024-01-24: 3 mL

## 2024-01-24 NOTE — Progress Notes (Signed)
Office Visit Note   Patient: Joan Case           Date of Birth: [birth year]           MRN: [id number] Visit Date: 01/24/2024              Requested by: No referring provider defined for this encounter. PCP: Patient, No Pcp Per   Assessment & Plan: Visit Diagnoses:  1. Chronic pain of left knee   2. Acute pain of right knee     Plan: Will have her call the office and 2 weeks to let us know whether or not she got relief with the injection if she continues to have pain in the knee despite the cortisone injection would recommend MRI to rule out internal derangement given the duration of the pain she has had.  Follow-Up Instructions: Return if symptoms worsen or fail to improve, for Patient to call office if right knee pain does not resolve.   Orders:  Orders Placed This Encounter  Procedures   Large Joint Inj: R knee   XR Knee 1-2 Views Left   No orders of the defined types were placed in this encounter.     Procedures: Large Joint Inj: R knee on 01/24/2024 2:59 PM Indications: pain Details: 22 G 1.5 in needle, anterolateral approach  Arthrogram: No  Medications: 3 mL lidocaine 1 %; 40 mg methylPREDNISolone acetate 40 MG/ML Outcome: tolerated well, no immediate complications Procedure, treatment alternatives, risks and benefits explained, specific risks discussed. Consent was given by the patient. Immediately prior to procedure a time out was called to verify the correct patient, procedure, equipment, support staff and site/side marked as required. Patient was prepped and draped in the usual sterile fashion.       Clinical Data: No additional findings.   Subjective: Chief Complaint  Patient presents with   Right Knee - Pain   Left Knee - Pain    HPI Joan Case 63 year old female comes in today with bilateral knee pain currently right knee worse than the left.  She states that in October she was seen at the [facility] ER due to right knee pain.  She  reports that she was on a salt block and twisted her right knee when it sounds like crumble.  She denies any mechanical symptoms in the knee.  Just describes knee pain as sharp pain especially with turning.  No fluid accumulation.  Knee pain has not significantly improved. Radiographs on canopy dated 10/10/2023 showed no acute fracture.  Mild degenerative changes. Review of Systems See HPI  Objective: Vital Signs: There were no vitals taken for this visit.  Physical Exam Constitutional:      Appearance: She is normal weight.  Pulmonary:     Effort: Pulmonary effort is normal.  Neurological:     Mental Status: She is alert and oriented to person, place, and time.  Psychiatric:        Mood and Affect: Mood normal.     Ortho Exam Bilateral knees good range of motion of both knees.  Right knee tenderness along medial joint line.  No instability valgus varus stressing of either knee.  Negative effusion bilateral knees.  McMurray's is negative bilaterally. Specialty Comments:  No specialty comments available.  Imaging: XR Knee 1-2 Views Left Result Date: 01/24/2024 AP lateral views left knee along with an AP view of the right knee: No acute fractures.  Left knee with moderate narrowing medial joint line and mild to moderate  patellofemoral changes.  No acute findings AP view of the right knee with mild degenerative changes.    PMFS History: Patient Active Problem List   Diagnosis Date Noted   Spondylolisthesis of lumbar region 03/10/2022   Past Medical History:  Diagnosis Date   Hypothyroidism    Narcolepsy     No family history on file.  Past Surgical History:  Procedure Laterality Date   ABDOMINAL HYSTERECTOMY  11/1992   CESAREAN SECTION  1993   TRANSFORAMINAL LUMBAR INTERBODY FUSION W/ MIS 1 LEVEL N/A 03/10/2022   Procedure: Lumbar Four- Five Minimally Invasive Transforaminal Lumbar Interbody Fusion with metrx;  Surgeon: [name], MD;  Location: [facility] OR;  Service:  Neurosurgery;  Laterality: N/A;  Lumbar Four- Five Minimally Invasive Transforaminal Lumbar Interbody Fusion with metrx   Social History   Occupational History   Not on file  Tobacco Use   Smoking status: Former    Current packs/day: 0.00    Types: Cigarettes    Quit date: 2021    Years since quitting: 4.0   Smokeless tobacco: Not on file  Vaping Use   Vaping status: Some Days  Substance and Sexual Activity   Alcohol use: Yes    Comment: occasionally   Drug use: Never   Sexual activity: Not on file

## 2024-10-23 ENCOUNTER — Encounter: Payer: Self-pay | Admitting: Radiology

## 2024-10-27 ENCOUNTER — Ambulatory Visit (HOSPITAL_BASED_OUTPATIENT_CLINIC_OR_DEPARTMENT_OTHER)

## 2024-10-27 ENCOUNTER — Ambulatory Visit (HOSPITAL_BASED_OUTPATIENT_CLINIC_OR_DEPARTMENT_OTHER): Admitting: Student

## 2024-10-27 DIAGNOSIS — G8929 Other chronic pain: Secondary | ICD-10-CM

## 2024-10-27 DIAGNOSIS — M25561 Pain in right knee: Secondary | ICD-10-CM

## 2024-10-27 NOTE — Progress Notes (Signed)
 Chief Complaint: Right knee pain     History of Present Illness:    Joan Case is a 63 y.o. female who presents today for evaluation of persistent pain in the right knee.  This originally began last October after she sustained a twisting injury.  She was then seen by my colleague Tory Gaskins on 2/3 and underwent a knee cortisone injection which she unfortunately reports did not give her much relief.  It was discussed at that time that she may need to pursue an MRI if symptoms continued for suspicion of internal derangement.  She continues to experience a frequent snapping sensation in the medial knee.  Pain has not improved despite usage of multiple braces and Advil .  No locking or buckling.  She is active and taking care of her 2 horses and enjoys playing racquetball.   Surgical History:   None of right knee  PMH/PSH/Family History/Social History/Meds/Allergies:    Past Medical History:  Diagnosis Date   Hypothyroidism    Narcolepsy    Past Surgical History:  Procedure Laterality Date   ABDOMINAL HYSTERECTOMY  11/1992   CESAREAN SECTION  1993   TRANSFORAMINAL LUMBAR INTERBODY FUSION W/ MIS 1 LEVEL N/A 03/10/2022   Procedure: Lumbar Four- Five Minimally Invasive Transforaminal Lumbar Interbody Fusion with metrx;  Surgeon: Cheryle Debby LABOR, MD;  Location: [facility] OR;  Service: Neurosurgery;  Laterality: N/A;  Lumbar Four- Five Minimally Invasive Transforaminal Lumbar Interbody Fusion with metrx   Social History   Socioeconomic History   Marital status: Married    Spouse name: Not on file   Number of children: Not on file   Years of education: Not on file   Highest education level: Not on file  Occupational History   Not on file  Tobacco Use   Smoking status: Former    Current packs/day: 0.00    Types: Cigarettes    Quit date: 2021    Years since quitting: 4.8   Smokeless tobacco: Not on file  Vaping Use   Vaping status: Some Days   Substance and Sexual Activity   Alcohol use: Yes    Comment: occasionally   Drug use: Never   Sexual activity: Not on file  Other Topics Concern   Not on file  Social History Narrative   Not on file   Social Drivers of Health   Financial Resource Strain: Not on file  Food Insecurity: Not on file  Transportation Needs: Not on file  Physical Activity: Not on file  Stress: Not on file  Social Connections: Not on file   No family history on file. Allergies  Allergen Reactions   Betadine [Povidone Iodine] Other (See Comments)    Unknown   Fruit & Vegetable Daily [Nutritional Supplements] Other (See Comments)    Raw    Peanut-Containing Drug Products Other (See Comments)    Unknown   Current Outpatient Medications  Medication Sig Dispense Refill   levothyroxine  (SYNTHROID ) 125 MCG tablet Take 125 mcg by mouth daily before breakfast.     lisdexamfetamine (VYVANSE ) 70 MG capsule Take 70 mg by mouth daily.     No current facility-administered medications for this visit.   No results found.  Review of Systems:   A ROS was performed including pertinent positives and negatives as documented in the HPI.  Physical  Exam :   Constitutional: NAD and appears stated age Neurological: Alert and oriented Psych: Appropriate affect and cooperative There were no vitals taken for this visit.   Comprehensive Musculoskeletal Exam:    Exam of the right knee demonstrates tenderness with palpation over the medial joint line.  Active range of motion is from 0 to 130 degrees without crepitus.  No significant effusion without overlying erythema or warmth.  Stable collaterals with varus and valgus stress.  Positive McMurray.  Imaging:   Xray (right knee 4 views): Tibiofemoral spaces are well-maintained with a very small medial joint line osteophyte.  Mild spurring noted off the superior pole of the patella.   I personally reviewed and interpreted the radiographs.   Assessment:   63 y.o.  female with history of chronic right knee pain that has persisted since a twisting injury just over 1 year ago.  She is experiencing frequent popping and snapping in the medial knee and has not gotten any relief despite conservative management including NSAIDs, bracing, and a cortisone injection.  Given presence of medial joint line pain, mechanical symptoms, and a positive Murray I do have suspicion for internal derangement likely in the setting of a meniscus tear therefore we will proceed with an MRI for further assessment.  Plan will be to have her follow back up shortly after to review the MRI and discuss treatment options.  Plan :    - Obtain MRI of the right knee and follow-up for review and treatment discussion     I personally saw and evaluated the patient, and participated in the management and treatment plan.  Leonce Reveal, PA-C Orthopedics

## 2024-11-10 ENCOUNTER — Ambulatory Visit
Admission: RE | Admit: 2024-11-10 | Discharge: 2024-11-10 | Disposition: A | Discharge status: Home / Self Care | Source: Ambulatory Visit | Attending: Student | Admitting: Student

## 2024-11-10 DIAGNOSIS — G8929 Other chronic pain: Secondary | ICD-10-CM

## 2024-11-20 ENCOUNTER — Ambulatory Visit (HOSPITAL_BASED_OUTPATIENT_CLINIC_OR_DEPARTMENT_OTHER): Admitting: Student

## 2024-11-20 DIAGNOSIS — M23611 Other spontaneous disruption of anterior cruciate ligament of right knee: Secondary | ICD-10-CM

## 2024-11-20 DIAGNOSIS — S83241A Other tear of medial meniscus, current injury, right knee, initial encounter: Secondary | ICD-10-CM | POA: Diagnosis not present

## 2024-11-20 NOTE — Progress Notes (Signed)
 Chief Complaint: Right knee pain     History of Present Illness:   11/20/24: Patient presents today for MRI review of the right knee.  Overall she reports no significant changes in symptoms and continues to experience popping within the medial knee.  Patient does report approximately 2 previous injuries to the right knee, the last occurring in October 2024.   Joan Case is a 63 y.o. female who presents today for evaluation of persistent pain in the right knee.  This originally began last October after she sustained a twisting injury.  She was then seen by my colleague Tory Gaskins on 2/3 and underwent a knee cortisone injection which she unfortunately reports did not give her much relief.  It was discussed at that time that she may need to pursue an MRI if symptoms continued for suspicion of internal derangement.  She continues to experience a frequent snapping sensation in the medial knee.  Pain has not improved despite usage of multiple braces and Advil .  No locking or buckling.  She is active and taking care of her 2 horses and enjoys playing racquetball.   Surgical History:   None of right knee  PMH/PSH/Family History/Social History/Meds/Allergies:    Past Medical History:  Diagnosis Date   Hypothyroidism    Narcolepsy    Past Surgical History:  Procedure Laterality Date   ABDOMINAL HYSTERECTOMY  11/1992   CESAREAN SECTION  1993   TRANSFORAMINAL LUMBAR INTERBODY FUSION W/ MIS 1 LEVEL N/A 03/10/2022   Procedure: Lumbar Four- Five Minimally Invasive Transforaminal Lumbar Interbody Fusion with metrx;  Surgeon: Cheryle Debby LABOR, MD;  Location: [facility] OR;  Service: Neurosurgery;  Laterality: N/A;  Lumbar Four- Five Minimally Invasive Transforaminal Lumbar Interbody Fusion with metrx   Social History   Socioeconomic History   Marital status: Married    Spouse name: Not on file   Number of children: Not on file   Years of education: Not on file    Highest education level: Not on file  Occupational History   Not on file  Tobacco Use   Smoking status: Former    Current packs/day: 0.00    Types: Cigarettes    Quit date: 2021    Years since quitting: 4.9   Smokeless tobacco: Not on file  Vaping Use   Vaping status: Some Days  Substance and Sexual Activity   Alcohol use: Yes    Comment: occasionally   Drug use: Never   Sexual activity: Not on file  Other Topics Concern   Not on file  Social History Narrative   Not on file   Social Drivers of Health   Financial Resource Strain: Not on file  Food Insecurity: Not on file  Transportation Needs: Not on file  Physical Activity: Not on file  Stress: Not on file  Social Connections: Not on file   No family history on file. Allergies  Allergen Reactions   Betadine [Povidone Iodine] Other (See Comments)    Unknown   Fruit & Vegetable Daily [Nutritional Supplements] Other (See Comments)    Raw    Peanut-Containing Drug Products Other (See Comments)    Unknown   Current Outpatient Medications  Medication Sig Dispense Refill   levothyroxine  (SYNTHROID ) 125 MCG tablet Take 125 mcg by mouth daily before breakfast.     lisdexamfetamine (VYVANSE )  70 MG capsule Take 70 mg by mouth daily.     No current facility-administered medications for this visit.   No results found.  Review of Systems:   A ROS was performed including pertinent positives and negatives as documented in the HPI.  Physical Exam :   Constitutional: NAD and appears stated age Neurological: Alert and oriented Psych: Appropriate affect and cooperative There were no vitals taken for this visit.   Comprehensive Musculoskeletal Exam:    Exam of the right knee demonstrates tenderness with palpation over the medial joint line.  Active range of motion is from 0 to 130 degrees without crepitus.  No significant effusion without overlying erythema or warmth.  Stable collaterals with varus and valgus stress.   Positive McMurray.  Imaging:   MRI right knee: Horizontal tear within the medial meniscus.  Chronic rupture of the proximal ACL.  Mild to moderate degenerative changes within the patellofemoral compartment.   I personally reviewed and interpreted the radiographs.   Assessment:   63 y.o. female with continued pain and mechanical symptoms of the right knee after a twisting injury sustained just over 1 year ago.  Review of her MRI today does show a fairly significant tear within the medial meniscus as was suspected.  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  This Mirna was a result of her injury last October however unable to be certain as she has experienced an knee injury previous to that episode.  Discussed case with Dr. Genelle who does believe that she would do well with arthroscopic intervention given the lack of degenerative changes within the tibiofemoral compartments in addition to her active lifestyle.  Discussed with patient today that procedure would likely involve an ACL reconstruction with allograft in addition to a medial meniscal repair/debridement.  Expected recovery timelines were discussed.  Patient would likely like to pursue or strongly consider this option, therefore I will have her schedule a close follow-up with Dr. Genelle in order to discuss surgery which could be used as a preop visit if it is determined to move forward.  Plan :    - Follow-up with Dr. Genelle to discuss surgical intervention     I personally saw and evaluated the patient, and participated in the management and treatment plan.  Leonce Reveal, PA-C Orthopedics

## 2025-01-03 ENCOUNTER — Ambulatory Visit (HOSPITAL_BASED_OUTPATIENT_CLINIC_OR_DEPARTMENT_OTHER): Admitting: Orthopaedic Surgery

## 2025-01-26 ENCOUNTER — Ambulatory Visit (HOSPITAL_BASED_OUTPATIENT_CLINIC_OR_DEPARTMENT_OTHER): Admitting: Orthopaedic Surgery

## 2025-01-26 DIAGNOSIS — M23611 Other spontaneous disruption of anterior cruciate ligament of right knee: Secondary | ICD-10-CM

## 2025-01-26 NOTE — Progress Notes (Signed)
 "                    Chief Complaint: Right knee pain        History of Present Illness:    01/26/2025: Presents today for follow-up of the right knee.  Denies any frank instability  11/20/24: Patient presents today for MRI review of the right knee.  Overall she reports no significant changes in symptoms and continues to experience popping within the medial knee.  Patient does report approximately 2 previous injuries to the right knee, the last occurring in October 2024.     Bailei Buist is a 64 y.o. female who presents today for evaluation of persistent pain in the right knee.  This originally began last October after she sustained a twisting injury.  She was then seen by my colleague Tory Gaskins on 2/3 and underwent a knee cortisone injection which she unfortunately reports did not give her much relief.  It was discussed at that time that she may need to pursue an MRI if symptoms continued for suspicion of internal derangement.  She continues to experience a frequent snapping sensation in the medial knee.  Pain has not improved despite usage of multiple braces and Advil .  No locking or buckling.  She is active and taking care of her 2 horses and enjoys playing racquetball.     Surgical History:   None of right knee   PMH/PSH/Family History/Social History/Meds/Allergies:         Past Medical History:  Diagnosis Date   Hypothyroidism     Narcolepsy               Past Surgical History:  Procedure Laterality Date   ABDOMINAL HYSTERECTOMY   11/1992   CESAREAN SECTION   1993   TRANSFORAMINAL LUMBAR INTERBODY FUSION W/ MIS 1 LEVEL N/A 03/10/2022    Procedure: Lumbar Four- Five Minimally Invasive Transforaminal Lumbar Interbody Fusion with metrx;  Surgeon: Cheryle Debby LABOR, MD;  Location: [facility] OR;  Service: Neurosurgery;  Laterality: N/A;  Lumbar Four- Five Minimally Invasive Transforaminal Lumbar Interbody Fusion with metrx        Social History         Socioeconomic History    Marital status: Married      Spouse name: Not on file   Number of children: Not on file   Years of education: Not on file   Highest education level: Not on file  Occupational History   Not on file  Tobacco Use   Smoking status: Former      Current packs/day: 0.00      Types: Cigarettes      Quit date: 2021      Years since quitting: 4.9   Smokeless tobacco: Not on file  Vaping Use   Vaping status: Some Days  Substance and Sexual Activity   Alcohol use: Yes      Comment: occasionally   Drug use: Never   Sexual activity: Not on file  Other Topics Concern   Not on file  Social History Narrative   Not on file    Social Drivers of Health    Financial Resource Strain: Not on file  Food Insecurity: Not on file  Transportation Needs: Not on file  Physical Activity: Not on file  Stress: Not on file  Social Connections: Not on file    No family history on file.     Allergies       Allergies  Allergen Reactions  Betadine [Povidone Iodine] Other (See Comments)      Unknown   Fruit & Vegetable Daily [Nutritional Supplements] Other (See Comments)      Raw     Peanut-Containing Drug Products Other (See Comments)      Unknown            Current Outpatient Medications  Medication Sig Dispense Refill   levothyroxine  (SYNTHROID ) 125 MCG tablet Take 125 mcg by mouth daily before breakfast.       lisdexamfetamine  (VYVANSE ) 70 MG capsule Take 70 mg by mouth daily.          No current facility-administered medications for this visit.      Imaging Results (Last 48 hours)  No results found.     Review of Systems:   A ROS was performed including pertinent positives and negatives as documented in the HPI.   Physical Exam :   Constitutional: NAD and appears stated age Neurological: Alert and oriented Psych: Appropriate affect and cooperative There were no vitals taken for this visit.    Comprehensive Musculoskeletal Exam:     Exam of the right knee demonstrates  tenderness with palpation over the medial joint line.  Active range of motion is from 0 to 130 degrees without crepitus.  No significant effusion without overlying erythema or warmth.  Stable collaterals with varus and valgus stress.  Positive McMurray.   Imaging:   MRI right knee: Horizontal tear within the medial meniscus.  Chronic rupture of the proximal ACL.  Mild to moderate degenerative changes within the patellofemoral compartment.     I personally reviewed and interpreted the radiographs.     Assessment:   64 y.o. female with continued pain and mechanical symptoms of the right knee after a twisting injury sustained just over 1 year ago.  Review of her MRI today does show a fairly significant tear within the medial meniscus as was suspected.  I did discuss that her ACL injury appears to be healed to the septum which is likely giving her some secondary stability.  At this time she is not not looking to pursue surgery and is considering more for a summer timeframe.  She will follow-up with us  as needed should she want to discuss  Plan :     - Return to clinic as needed         I personally saw and evaluated the patient, and participated in the management and treatment plan.   "
